# Patient Record
Sex: Female | Born: 1970
Health system: Southern US, Community
[De-identification: ages and names within clinical notes are randomized; demographics above are authoritative.]

---

## 2000-10-14 ENCOUNTER — Other Ambulatory Visit: Admission: RE | Admit: 2000-10-14 | Discharge: 2000-10-14 | Payer: Self-pay | Admitting: Obstetrics & Gynecology

## 2001-11-06 ENCOUNTER — Other Ambulatory Visit: Admission: RE | Admit: 2001-11-06 | Discharge: 2001-11-06 | Payer: Self-pay | Admitting: Obstetrics & Gynecology

## 2002-11-13 ENCOUNTER — Other Ambulatory Visit: Admission: RE | Admit: 2002-11-13 | Discharge: 2002-11-13 | Payer: Self-pay | Admitting: Obstetrics & Gynecology

## 2003-11-10 ENCOUNTER — Other Ambulatory Visit: Admission: RE | Admit: 2003-11-10 | Discharge: 2003-11-10 | Payer: Self-pay | Admitting: Obstetrics & Gynecology

## 2004-06-02 ENCOUNTER — Inpatient Hospital Stay (HOSPITAL_COMMUNITY): Admission: AD | Admit: 2004-06-02 | Discharge: 2004-06-04 | Payer: Self-pay | Admitting: Obstetrics & Gynecology

## 2004-06-02 ENCOUNTER — Encounter (INDEPENDENT_AMBULATORY_CARE_PROVIDER_SITE_OTHER): Payer: Self-pay | Admitting: *Deleted

## 2008-02-09 ENCOUNTER — Inpatient Hospital Stay (HOSPITAL_COMMUNITY): Admission: AD | Admit: 2008-02-09 | Discharge: 2008-02-11 | Payer: Self-pay | Admitting: Obstetrics & Gynecology

## 2009-03-25 ENCOUNTER — Ambulatory Visit: Payer: Self-pay | Admitting: Internal Medicine

## 2009-05-23 ENCOUNTER — Ambulatory Visit: Payer: Self-pay | Admitting: Internal Medicine

## 2010-06-11 NOTE — L&D Delivery Note (Signed)
Delivery Note At 11:43 AM a viable female was delivered via Vaginal, Spontaneous Delivery (Presentation: Right Occiput Anterior).  APGAR: 9, 9; weight 8 lb 3.6 oz (3730 g).   Placenta status: Intact, Spontaneous.  Cord: 3 vessels with the following complications: None.  Cord pH: na  Anesthesia: Epidural  Episiotomy: None Lacerations: 2nd degree Suture Repair: 2.0 vicryl rapide Est. Blood Loss (mL): 500  Mom to postpartum.  Baby to nursery-stable.  Eason Housman J 01/21/2011, 12:14 PM

## 2010-06-20 LAB — ABO/RH

## 2010-06-20 LAB — RUBELLA ANTIBODY, IGM: Rubella: IMMUNE

## 2010-07-02 ENCOUNTER — Encounter: Payer: Self-pay | Admitting: Obstetrics & Gynecology

## 2011-01-21 ENCOUNTER — Encounter (HOSPITAL_COMMUNITY): Payer: Self-pay | Admitting: *Deleted

## 2011-01-21 ENCOUNTER — Encounter (HOSPITAL_COMMUNITY): Payer: Self-pay | Admitting: Anesthesiology

## 2011-01-21 ENCOUNTER — Inpatient Hospital Stay (HOSPITAL_COMMUNITY): Payer: 59 | Admitting: Anesthesiology

## 2011-01-21 ENCOUNTER — Inpatient Hospital Stay (HOSPITAL_COMMUNITY)
Admission: AD | Admit: 2011-01-21 | Discharge: 2011-01-22 | DRG: 775 | Disposition: A | Discharge status: Home / Self Care | Payer: 59 | Source: Ambulatory Visit | Attending: Obstetrics and Gynecology | Admitting: Obstetrics and Gynecology

## 2011-01-21 DIAGNOSIS — O09529 Supervision of elderly multigravida, unspecified trimester: Secondary | ICD-10-CM | POA: Diagnosis present

## 2011-01-21 DIAGNOSIS — O9903 Anemia complicating the puerperium: Secondary | ICD-10-CM | POA: Diagnosis not present

## 2011-01-21 DIAGNOSIS — O99892 Other specified diseases and conditions complicating childbirth: Secondary | ICD-10-CM | POA: Diagnosis present

## 2011-01-21 DIAGNOSIS — Z22330 Carrier of Group B streptococcus: Secondary | ICD-10-CM

## 2011-01-21 DIAGNOSIS — D509 Iron deficiency anemia, unspecified: Secondary | ICD-10-CM | POA: Diagnosis present

## 2011-01-21 DIAGNOSIS — D62 Acute posthemorrhagic anemia: Secondary | ICD-10-CM | POA: Diagnosis not present

## 2011-01-21 LAB — COMPREHENSIVE METABOLIC PANEL
ALT: 12 U/L (ref 0–35)
AST: 15 U/L (ref 0–37)
Albumin: 2.6 g/dL — ABNORMAL LOW (ref 3.5–5.2)
Alkaline Phosphatase: 163 U/L — ABNORMAL HIGH (ref 39–117)
BUN: 6 mg/dL (ref 6–23)
CO2: 20 mEq/L (ref 19–32)
Calcium: 8.5 mg/dL (ref 8.4–10.5)
Chloride: 102 mEq/L (ref 96–112)
Creatinine, Ser: <0.47 mg/dL — ABNORMAL LOW (ref 0.50–1.10)
Glucose, Bld: 87 mg/dL (ref 70–99)
Potassium: 3.8 mEq/L (ref 3.5–5.1)
Sodium: 131 mEq/L — ABNORMAL LOW (ref 135–145)
Total Bilirubin: 0.2 mg/dL — ABNORMAL LOW (ref 0.3–1.2)
Total Protein: 5.7 g/dL — ABNORMAL LOW (ref 6.0–8.3)

## 2011-01-21 LAB — CBC
HCT: 30 % — ABNORMAL LOW (ref 36.0–46.0)
Hemoglobin: 10.1 g/dL — ABNORMAL LOW (ref 12.0–15.0)
MCH: 29.7 pg (ref 26.0–34.0)
MCHC: 33.7 g/dL (ref 30.0–36.0)
MCV: 88.2 fL (ref 78.0–100.0)
Platelets: 241 10*3/uL (ref 150–400)
RBC: 3.4 MIL/uL — ABNORMAL LOW (ref 3.87–5.11)
RDW: 13.6 % (ref 11.5–15.5)
WBC: 9.5 10*3/uL (ref 4.0–10.5)

## 2011-01-21 LAB — ABO/RH: ABO/RH(D): O POS

## 2011-01-21 LAB — URIC ACID: Uric Acid, Serum: 3.3 mg/dL (ref 2.4–7.0)

## 2011-01-21 LAB — RPR: RPR Ser Ql: NONREACTIVE

## 2011-01-21 MED ORDER — DIPHENHYDRAMINE HCL 25 MG PO CAPS: 25.0000 mg | ORAL_CAPSULE | Freq: Four times a day (QID) | ORAL | Status: DC | PRN

## 2011-01-21 MED ORDER — OXYCODONE-ACETAMINOPHEN 5-325 MG PO TABS: 1.0000 | ORAL_TABLET | ORAL | Status: DC | PRN

## 2011-01-21 MED ORDER — TETANUS-DIPHTH-ACELL PERTUSSIS 5-2.5-18.5 LF-MCG/0.5 IM SUSP
0.5000 mL | Freq: Once | INTRAMUSCULAR | Status: AC
  Administered 2011-01-22: 0.5 mL via INTRAMUSCULAR
  Filled 2011-01-21: qty 0.5

## 2011-01-21 MED ORDER — OXYTOCIN BOLUS FROM INFUSION
500.0000 mL | Freq: Once | INTRAVENOUS | Status: DC
  Filled 2011-01-21: qty 500

## 2011-01-21 MED ORDER — DIPHENHYDRAMINE HCL 50 MG/ML IJ SOLN: 12.5000 mg | INTRAMUSCULAR | Status: DC | PRN

## 2011-01-21 MED ORDER — PRENATAL PLUS 27-1 MG PO TABS
1.0000 | ORAL_TABLET | Freq: Every day | ORAL | Status: DC
  Administered 2011-01-21 – 2011-01-22 (×2): 1 via ORAL
  Filled 2011-01-21 (×2): qty 1

## 2011-01-21 MED ORDER — IBUPROFEN 600 MG PO TABS
600.0000 mg | ORAL_TABLET | Freq: Four times a day (QID) | ORAL | Status: DC
  Administered 2011-01-21 – 2011-01-22 (×5): 600 mg via ORAL
  Filled 2011-01-21 (×5): qty 1

## 2011-01-21 MED ORDER — SENNOSIDES-DOCUSATE SODIUM 8.6-50 MG PO TABS
2.0000 | ORAL_TABLET | Freq: Every day | ORAL | Status: DC
  Administered 2011-01-21: 2 via ORAL

## 2011-01-21 MED ORDER — PHENYLEPHRINE 40 MCG/ML (10ML) SYRINGE FOR IV PUSH (FOR BLOOD PRESSURE SUPPORT)
80.0000 ug | PREFILLED_SYRINGE | INTRAVENOUS | Status: DC | PRN
  Filled 2011-01-21 (×2): qty 5

## 2011-01-21 MED ORDER — SIMETHICONE 80 MG PO CHEW: 80.0000 mg | CHEWABLE_TABLET | ORAL | Status: DC | PRN

## 2011-01-21 MED ORDER — METHYLERGONOVINE MALEATE 0.2 MG/ML IJ SOLN: 0.2000 mg | INTRAMUSCULAR | Status: DC | PRN

## 2011-01-21 MED ORDER — DIBUCAINE 1 % RE OINT: 1.0000 "application " | TOPICAL_OINTMENT | RECTAL | Status: DC | PRN

## 2011-01-21 MED ORDER — FLEET ENEMA 7-19 GM/118ML RE ENEM: 1.0000 | ENEMA | RECTAL | Status: DC | PRN

## 2011-01-21 MED ORDER — ONDANSETRON HCL 4 MG/2ML IJ SOLN: 4.0000 mg | INTRAMUSCULAR | Status: DC | PRN

## 2011-01-21 MED ORDER — LIDOCAINE HCL 1.5 % IJ SOLN
INTRAMUSCULAR | Status: DC | PRN
  Administered 2011-01-21 (×2): 4 mL via EPIDURAL

## 2011-01-21 MED ORDER — LACTATED RINGERS IV SOLN: 500.0000 mL | INTRAVENOUS | Status: DC | PRN

## 2011-01-21 MED ORDER — IBUPROFEN 600 MG PO TABS: 600.0000 mg | ORAL_TABLET | Freq: Four times a day (QID) | ORAL | Status: DC | PRN

## 2011-01-21 MED ORDER — CLINDAMYCIN PHOSPHATE 900 MG/50ML IV SOLN
900.0000 mg | Freq: Three times a day (TID) | INTRAVENOUS | Status: DC
  Administered 2011-01-21: 900 mg via INTRAVENOUS
  Filled 2011-01-21 (×4): qty 50

## 2011-01-21 MED ORDER — OXYTOCIN 20 UNITS IN LACTATED RINGERS INFUSION - SIMPLE: 125.0000 mL/h | INTRAVENOUS | Status: DC | PRN

## 2011-01-21 MED ORDER — ZOLPIDEM TARTRATE 5 MG PO TABS: 5.0000 mg | ORAL_TABLET | Freq: Every evening | ORAL | Status: DC | PRN

## 2011-01-21 MED ORDER — OXYTOCIN 20 UNITS IN LACTATED RINGERS INFUSION - SIMPLE
125.0000 mL/h | INTRAVENOUS | Status: DC
  Filled 2011-01-21: qty 1000

## 2011-01-21 MED ORDER — ONDANSETRON HCL 4 MG PO TABS: 4.0000 mg | ORAL_TABLET | ORAL | Status: DC | PRN

## 2011-01-21 MED ORDER — LACTATED RINGERS IV SOLN: INTRAVENOUS | Status: DC

## 2011-01-21 MED ORDER — WITCH HAZEL-GLYCERIN EX PADS: 1.0000 "application " | MEDICATED_PAD | CUTANEOUS | Status: DC | PRN

## 2011-01-21 MED ORDER — ACETAMINOPHEN 325 MG PO TABS: 650.0000 mg | ORAL_TABLET | ORAL | Status: DC | PRN

## 2011-01-21 MED ORDER — LACTATED RINGERS IV SOLN
INTRAVENOUS | Status: DC
  Administered 2011-01-21 (×2): via INTRAVENOUS

## 2011-01-21 MED ORDER — FENTANYL 2.5 MCG/ML BUPIVACAINE 1/10 % EPIDURAL INFUSION (WH - ANES)
14.0000 mL/h | INTRAMUSCULAR | Status: DC
  Administered 2011-01-21 (×2): 14 mL/h via EPIDURAL
  Filled 2011-01-21 (×3): qty 60

## 2011-01-21 MED ORDER — EPHEDRINE 5 MG/ML INJ
10.0000 mg | INTRAVENOUS | Status: DC | PRN
  Filled 2011-01-21: qty 4

## 2011-01-21 MED ORDER — LIDOCAINE HCL (PF) 1 % IJ SOLN
30.0000 mL | INTRAMUSCULAR | Status: DC | PRN
  Administered 2011-01-21: 30 mL via SUBCUTANEOUS
  Filled 2011-01-21: qty 30

## 2011-01-21 MED ORDER — ONDANSETRON HCL 4 MG/2ML IJ SOLN: 4.0000 mg | Freq: Four times a day (QID) | INTRAMUSCULAR | Status: DC | PRN

## 2011-01-21 MED ORDER — TERBUTALINE SULFATE 1 MG/ML IJ SOLN: 0.2500 mg | Freq: Once | INTRAMUSCULAR | Status: DC | PRN

## 2011-01-21 MED ORDER — BENZOCAINE-MENTHOL 20-0.5 % EX AERO
1.0000 "application " | INHALATION_SPRAY | CUTANEOUS | Status: DC | PRN
  Administered 2011-01-21: 1 via TOPICAL

## 2011-01-21 MED ORDER — LACTATED RINGERS IV SOLN: 500.0000 mL | Freq: Once | INTRAVENOUS | Status: DC

## 2011-01-21 MED ORDER — EPHEDRINE 5 MG/ML INJ
10.0000 mg | INTRAVENOUS | Status: DC | PRN
  Filled 2011-01-21 (×2): qty 4

## 2011-01-21 MED ORDER — CITRIC ACID-SODIUM CITRATE 334-500 MG/5ML PO SOLN: 30.0000 mL | ORAL | Status: DC | PRN

## 2011-01-21 MED ORDER — BENZOCAINE-MENTHOL 20-0.5 % EX AERO
INHALATION_SPRAY | CUTANEOUS | Status: AC
  Administered 2011-01-21: 1 via TOPICAL
  Filled 2011-01-21: qty 56

## 2011-01-21 MED ORDER — PHENYLEPHRINE 40 MCG/ML (10ML) SYRINGE FOR IV PUSH (FOR BLOOD PRESSURE SUPPORT)
80.0000 ug | PREFILLED_SYRINGE | INTRAVENOUS | Status: DC | PRN
  Filled 2011-01-21: qty 5

## 2011-01-21 MED ORDER — OXYTOCIN 20 UNITS IN LACTATED RINGERS INFUSION - SIMPLE
1.0000 m[IU]/min | INTRAVENOUS | Status: DC
  Administered 2011-01-21: 333 m[IU]/min via INTRAVENOUS
  Administered 2011-01-21: 2 m[IU]/min via INTRAVENOUS

## 2011-01-21 MED ORDER — LANOLIN HYDROUS EX OINT: TOPICAL_OINTMENT | CUTANEOUS | Status: DC | PRN

## 2011-01-21 MED ORDER — METHYLERGONOVINE MALEATE 0.2 MG PO TABS: 0.2000 mg | ORAL_TABLET | ORAL | Status: DC | PRN

## 2011-01-21 NOTE — Progress Notes (Signed)
Pt moved to 143

## 2011-01-21 NOTE — Progress Notes (Signed)
Pt states, " Pt states, I started having contractions at 2200 and they are every three in now. I 've had bloody show all day and a trickle of water at 2 am."

## 2011-01-21 NOTE — Progress Notes (Signed)
Dr. Billy Coast notified of pt status, SVE, FHR, and UC pattern.  Orders received, will continue to monitor.

## 2011-01-21 NOTE — Progress Notes (Signed)
Dr. Billy Coast at bedside and notified of pt status, SVE, FHR, UC pattern, and pitocin amount. AROM, will continue to monitor.

## 2011-01-21 NOTE — Progress Notes (Signed)
Shawna White is a 40 y.o. G3P2 at [redacted]w[redacted]d by LMP admitted for active labor  Subjective:   Objective: BP 126/78  Pulse 90  Temp(Src) 98 F (36.7 C) (Oral)  Resp 18  Ht 5' 5.5" (1.664 m)  Wt 106.595 kg (235 lb)  BMI 38.51 kg/m2      FHT:  FHR: 150 bpm, variability: moderate,  accelerations:  Present,  decelerations:  Absent UC:   regular, every 2 minutes SVE:   9/100%/vtx/0 AROM(clear)  Labs: Lab Results  Component Value Date   WBC 9.5 01/21/2011   HGB 10.1* 01/21/2011   HCT 30.0* 01/21/2011   MCV 88.2 01/21/2011   PLT 241 01/21/2011    Assessment / Plan: Spontaneous labor, progressing normally  Labor: Progressing normally Preeclampsia:  na Fetal Wellbeing:  Category I Pain Control:  Epidural I/D:  n/a Anticipated MOD:  NSVD  Shawna White J 01/21/2011, 11:09 AM

## 2011-01-21 NOTE — Anesthesia Preprocedure Evaluation (Signed)
Anesthesia Evaluation  Name, MR# and DOB Patient awake  General Assessment Comment  Reviewed: Allergy & Precautions, H&P , NPO status , Patient's Chart, lab work & pertinent test results  Airway Mallampati: III TM Distance: >3 FB Neck ROM: full    Dental No notable dental hx. (+) Teeth Intact   Pulmonary  clear to auscultation  pulmonary exam normalPulmonary Exam Normal breath sounds clear to auscultation none    Cardiovascular regular Normal    Neuro/Psych Negative Neurological ROS  Negative Psych ROS  GI/Hepatic/Renal negative GI ROS, negative Liver ROS, and negative Renal ROS (+)       Endo/Other  Negative Endocrine ROS (+)   Morbid obesity  Abdominal   Musculoskeletal   Hematology negative hematology ROS (+)   Peds  Reproductive/Obstetrics (+) Pregnancy    Anesthesia Other Findings             Anesthesia Physical Anesthesia Plan  ASA: III  Anesthesia Plan: Epidural   Post-op Pain Management:    Induction:   Airway Management Planned:   Additional Equipment:   Intra-op Plan:   Post-operative Plan:   Informed Consent: I have reviewed the patients History and Physical, chart, labs and discussed the procedure including the risks, benefits and alternatives for the proposed anesthesia with the patient or authorized representative who has indicated his/her understanding and acceptance.     Plan Discussed with: Anesthesiologist  Anesthesia Plan Comments:         Anesthesia Quick Evaluation

## 2011-01-21 NOTE — Progress Notes (Signed)
Pt eating lunch

## 2011-01-21 NOTE — ED Notes (Signed)
Pt to BS via w/c 

## 2011-01-21 NOTE — Progress Notes (Signed)
Dr. Billy Coast at bedside and delivery and notified of pt status and reviewing FHR tracing.

## 2011-01-21 NOTE — Progress Notes (Signed)
  S: Feeling no pain or pressure with epidural     No PIH symptoms  O:  VS: Blood pressure 143/83, pulse 78, temperature 97.8 F (36.6 C), temperature source Oral, resp. rate 18, height 5' 5.5" (1.664 m), weight 106.595 kg (235 lb).        FHR : baseline 140 / variability moderate / accels +  / decels none        EFM: Category 1        Toco: contractions every 2-4 minutes / moderate lasting 40-60 sec        Cervix : 7 cm / 90 % / vtx / -1 per RN exam in past 20 minutes with foley and position change        Membranes: intact        Clindamycin dose infused over an hour - no reaction          Labs: CBC 9.5 - 10.1 -30.0 - 241 / uric acid 3.3 / SGOT 15 / SGPT 12  A: Active labor      No evidence of preeclampsia  P: Expectant management      Recheck in next hour for progression  - consider AROM or pitocin augmentation if no preogression      MD to follow - report to Rande Lawman 01/21/2011, 7:24 AM

## 2011-01-21 NOTE — Progress Notes (Signed)
NSVD, Dr. Billy Coast, female, apgars,9,9.

## 2011-01-21 NOTE — Anesthesia Procedure Notes (Addendum)
Epidural Patient location during procedure: OB Start time: 01/21/2011 4:03 AM  Staffing Anesthesiologist: Valissa Lyvers A. Performed by: anesthesiologist   Preanesthetic Checklist Completed: patient identified, site marked, surgical consent, pre-op evaluation, timeout performed, IV checked, risks and benefits discussed and monitors and equipment checked  Epidural Patient position: sitting Prep: site prepped and draped and DuraPrep Patient monitoring: continuous pulse ox and blood pressure Approach: midline Injection technique: LOR air  Needle:  Needle type: Tuohy  Needle gauge: 17 G Needle length: 9 cm Needle insertion depth: 7 cm Catheter type: closed end flexible Catheter size: 19 Gauge Catheter at skin depth: 12 cm Test dose: negative and 1.5% lidocaine  Assessment Events: blood not aspirated, injection not painful, no injection resistance, negative IV test and no paresthesia  Additional Notes Patient is more comfortable after epidural dosed. Please see RN's note for documentation of vital signs and FHR which are stable.

## 2011-01-21 NOTE — Progress Notes (Signed)
Report called to [facility] in [facility]

## 2011-01-21 NOTE — H&P (Signed)
  Shawna White is a 40 y.o. female presenting for onset of labor. [facility] at [facility] since 8 wks with Dr Seymour Bars as primary.  OB History    Grav Para Term Preterm Abortions TAB SAB Ect Mult Living   3 2        2      SVD x 2 - + GBS in past 2 pregnancies (does not remember what medicine that she received) Proven to 8-13 last delivery  History reviewed. No pertinent past medical history. Obesity. History reviewed. No pertinent past surgical history. Family History: family history is not on file. Social History:  does not have a smoking history on file. She does not have any smokeless tobacco history on file. Her alcohol and drug histories not on file.  Physical Exam: NAD but very uncomfortable with ctx - requesting epidural Heart RRR Lungs tachypnea with ctx, clear Abdomen gravid and non-tender Extremities trace edema  Cervical exam:  Dilation: 5 Effacement (%): 80 Station: -1 Exam by:: Quintella Baton RNC Blood pressure 143/86, pulse 85, temperature 97.3 F (36.3 C), temperature source Oral, resp. rate 18, height 5' 5.5" (1.664 m), weight 106.595 kg (235 lb).    Prenatal labs: ABO, Rh:   O + Antibody: Negative (01/10 0000) Rubella:  Immune RPR: Nonreactive (01/10 0000)  HBsAg: Negative (01/10 0000)  HIV: Non-reactive (01/10 0000)  GBS: Positive (07/09 0000)  1 hr Glucola- 135 Genetic screening- normal Anatomy US - normal   Assessment/Plan: Active labor with + GBS  1) admit 2) epidural 3) consulted pharmacist for ABX - recommended Clindamycin because stated allergy was to specifically erythromycin (+sensitivities)  4) add PIH labs for elevated BP  Shawna White 01/21/2011, 3:58 AM

## 2011-01-21 NOTE — Progress Notes (Signed)
Wiliam Ke CNM notified of pt's admission and status. Aware of ctx pattern, sve, +GBS and allergies. Will admit to BS.

## 2011-01-22 DIAGNOSIS — D509 Iron deficiency anemia, unspecified: Secondary | ICD-10-CM | POA: Diagnosis present

## 2011-01-22 LAB — CBC
HCT: 24.7 % — ABNORMAL LOW (ref 36.0–46.0)
Hemoglobin: 8.3 g/dL — ABNORMAL LOW (ref 12.0–15.0)
MCHC: 33.6 g/dL (ref 30.0–36.0)
RDW: 13.8 % (ref 11.5–15.5)
WBC: 9.1 10*3/uL (ref 4.0–10.5)

## 2011-01-22 MED ORDER — IBUPROFEN 800 MG PO TABS: 800.0000 mg | ORAL_TABLET | Freq: Three times a day (TID) | ORAL | Status: AC | PRN

## 2011-01-22 MED ORDER — NIFEREX-150 150-50-50 MG PO CAPS: 150.0000 mg | ORAL_CAPSULE | ORAL | Status: DC

## 2011-01-22 NOTE — Progress Notes (Signed)
  PPD 1 SVD  S:  Reports feeling well - desires early discharge             Tolerating po/ No nausea or vomiting             Bleeding is light             Pain controlled withprescription NSAID's including motrin with minimal cramp relief and narcotic analgesics including percocet yesterday - none today             Up ad lib / ambulatory  Newborn breast feeding               No PIH symptoms   O:  A & O x 3 NAD             VS: Blood pressure 120/77, pulse 88, temperature 98.5 F (36.9 C), temperature source Oral, resp. rate 18, height 5' 5.5" (1.664 m), weight 106.595 kg (235 lb), SpO2 98.00%, unknown if currently breastfeeding.  LABS:  Basename 01/22/11 0505 01/21/11 0307  HGB 8.3* 10.1*  HCT 24.7* 30.0*    I&O: I/O last 3 completed shifts: In: -  Out: 1000 [Blood:1000]      Lungs: Clear and unlabored  Heart: regular rate and rhythm / no mumurs  Abdomen: soft, non-tender, non-distended             Fundus: firm, non-tender, U-1  Perineum: no edema  Lochia: light  Extremities: trace edema, no calf pain or tenderness, negative Homans    A/P: PPD # 1 SVD             Labile hypertension - no previous history HTN / no evidence of PEC              ABL anemia compounding iron deficiency anemia  Doing well - stable status  Routine post partum orders  Discharge today after lunch - BP in office next week  Aceton Kinnear 01/22/2011, 9:48 AM

## 2011-01-22 NOTE — Discharge Summary (Signed)
Obstetric Discharge Summary Reason for Admission: onset of labor Prenatal Procedures: none Intrapartum Procedures: spontaneous vaginal delivery Postpartum Procedures: none Complications-Operative and Postpartum: none Hemoglobin  Date Value Range Status  01/22/2011 8.3* 12.0-15.0 (g/dL) Final     DELTA CHECK NOTED     REPEATED TO VERIFY     HCT  Date Value Range Status  01/22/2011 24.7* 36.0-46.0 (%) Final    Discharge Diagnoses: Term Pregnancy-delivered  Discharge Information: Date: 01/22/2011 Activity: pelvic rest Diet: routine Medications: Ibuprophen prenatal vitamin, nifferex (iron) Condition: stable Instructions: refer to practice specific booklet Discharge to: home Follow-up Information    Follow up with [name],[name]. Make an appointment in 6 weeks. (1 week Blood pressure check - call for apt)    Contact information:   [address] [place] [address] [phone]          Newborn Data: Live born female  Birth Weight: 8 lb 3.6 oz (3730 g) APGAR: 9, 9  Home with mother.  Shawna White,Shawna White 01/22/2011, 9:58 AM

## 2011-01-23 ENCOUNTER — Inpatient Hospital Stay (HOSPITAL_COMMUNITY): Admission: RE | Admit: 2011-01-23 | Payer: 59 | Source: Ambulatory Visit

## 2011-01-23 NOTE — Anesthesia Postprocedure Evaluation (Signed)
  Anesthesia Post-op Note  Patient: Shawna White  Procedure(s) Performed: * Lumbar Epidural for L & D *  Patient Location: Labor and Delivery  Anesthesia Type: Epidural  Level of Consciousness: awake, alert  and oriented  Airway and Oxygen Therapy: Patient Spontanous Breathing  Post-op Pain: none  Post-op Assessment: Post-op Vital signs reviewed, Patient's Cardiovascular Status Stable, Respiratory Function Stable, Patent Airway, No signs of Nausea or vomiting, Pain level controlled, No headache, No backache, No residual numbness and No residual motor weakness  Post-op Vital Signs: Reviewed and stable  Complications: No apparent anesthesia complications

## 2011-03-14 LAB — CBC
MCHC: 34.3
Platelets: 191
RDW: 13.3

## 2014-04-12 ENCOUNTER — Encounter (HOSPITAL_COMMUNITY): Payer: Self-pay | Admitting: *Deleted

## 2016-03-26 ENCOUNTER — Other Ambulatory Visit: Payer: Self-pay | Admitting: Endocrinology

## 2016-03-26 DIAGNOSIS — E041 Nontoxic single thyroid nodule: Secondary | ICD-10-CM

## 2016-03-30 ENCOUNTER — Ambulatory Visit
Admission: RE | Admit: 2016-03-30 | Discharge: 2016-03-30 | Disposition: A | Discharge status: Home / Self Care | Payer: 59 | Source: Ambulatory Visit | Attending: Endocrinology | Admitting: Endocrinology

## 2016-03-30 DIAGNOSIS — E041 Nontoxic single thyroid nodule: Secondary | ICD-10-CM

## 2016-04-05 ENCOUNTER — Other Ambulatory Visit: Payer: Self-pay | Admitting: Endocrinology

## 2016-04-05 DIAGNOSIS — E041 Nontoxic single thyroid nodule: Secondary | ICD-10-CM

## 2016-04-12 ENCOUNTER — Other Ambulatory Visit (HOSPITAL_COMMUNITY)
Admission: RE | Admit: 2016-04-12 | Discharge: 2016-04-12 | Disposition: A | Discharge status: Home / Self Care | Payer: 59 | Source: Ambulatory Visit | Attending: Radiology | Admitting: Radiology

## 2016-04-12 ENCOUNTER — Ambulatory Visit
Admission: RE | Admit: 2016-04-12 | Discharge: 2016-04-12 | Disposition: A | Discharge status: Home / Self Care | Payer: 59 | Source: Ambulatory Visit | Attending: Endocrinology | Admitting: Endocrinology

## 2016-04-12 DIAGNOSIS — E042 Nontoxic multinodular goiter: Secondary | ICD-10-CM | POA: Insufficient documentation

## 2016-04-12 DIAGNOSIS — E041 Nontoxic single thyroid nodule: Secondary | ICD-10-CM

## 2017-03-19 ENCOUNTER — Encounter: Payer: Self-pay | Admitting: Obstetrics & Gynecology

## 2017-03-19 ENCOUNTER — Ambulatory Visit (INDEPENDENT_AMBULATORY_CARE_PROVIDER_SITE_OTHER): Payer: 59 | Admitting: Obstetrics & Gynecology

## 2017-03-19 VITALS — BP 140/84 | Ht 64.75 in | Wt 214.0 lb

## 2017-03-19 DIAGNOSIS — N943 Premenstrual tension syndrome: Secondary | ICD-10-CM | POA: Diagnosis not present

## 2017-03-19 DIAGNOSIS — Z975 Presence of (intrauterine) contraceptive device: Secondary | ICD-10-CM

## 2017-03-19 DIAGNOSIS — N920 Excessive and frequent menstruation with regular cycle: Secondary | ICD-10-CM

## 2017-03-19 DIAGNOSIS — Z01419 Encounter for gynecological examination (general) (routine) without abnormal findings: Secondary | ICD-10-CM

## 2017-03-19 DIAGNOSIS — Z23 Encounter for immunization: Secondary | ICD-10-CM

## 2017-03-19 NOTE — Progress Notes (Signed)
Shawna White Apr 24, 1971 [id number]   History:    46 y.o. G3P3 married.  Children 12-9 and 14 yo.  RP:  Established patient presenting for annual gyn exam   HPI:  Well on Mirena IUD x 09/2013, but prolonged bleeding x last 2 months in spite of being on Camila as well for cycle control and PMS.  LMP lasted 3.5 wks, the one before 2 wks.  No pelvic pain.  No abnormal d/c.  Breasts wnl.  Mictions/BMs wnl.  BMI 35.89.  Past medical history,surgical history, family history and social history were all reviewed and documented in the EPIC chart.  Gynecologic History Patient's last menstrual period was 02/18/2017. Contraception: IUD and oral progesterone-only contraceptive Last Pap: 02/2016. Results were: neg/HPV HR neg Last mammogram: 01/2015. Results were: Negative  Obstetric History OB History  Gravida Para Term Preterm AB Living  SAB TAB Ectopic Multiple Live Births          1    # Outcome Date GA Lbr Len/2nd Weight Sex Delivery Anes PTL Lv  3 Term 01/21/11 [redacted]w[redacted]d 13:25 / 00:18 8 lb 3.6 oz (3.73 kg) F Vag-Spont EPI  LIV     Birth Comments: WNL  2 Para           1 Para                ROS: A ROS was performed and pertinent positives and negatives are included in the history.  GENERAL: No fevers or chills. HEENT: No change in vision, no earache, sore throat or sinus congestion. NECK: No pain or stiffness. CARDIOVASCULAR: No chest pain or pressure. No palpitations. PULMONARY: No shortness of breath, cough or wheeze. GASTROINTESTINAL: No abdominal pain, nausea, vomiting or diarrhea, melena or bright red blood per rectum. GENITOURINARY: No urinary frequency, urgency, hesitancy or dysuria. MUSCULOSKELETAL: No joint or muscle pain, no back pain, no recent trauma. DERMATOLOGIC: No rash, no itching, no lesions. ENDOCRINE: No polyuria, polydipsia, no heat or cold intolerance. No recent change in weight. HEMATOLOGICAL: No anemia or easy bruising or bleeding. NEUROLOGIC: No headache,  seizures, numbness, tingling or weakness. PSYCHIATRIC: No depression, no loss of interest in normal activity or change in sleep pattern.     Exam:   BP 140/84   Ht 5' 4.75" (1.645 m)   Wt 214 lb (97.1 kg)   LMP 02/18/2017   BMI 35.89 kg/m   Body mass index is 35.89 kg/m.  General appearance : Well developed well nourished female. No acute distress HEENT: Eyes: no retinal hemorrhage or exudates,  Neck supple, trachea midline, no carotid bruits, no thyroidmegaly Lungs: Clear to auscultation, no rhonchi or wheezes, or rib retractions  Heart: Regular rate and rhythm, no murmurs or gallops Breast:Examined in sitting and supine position were symmetrical in appearance, no palpable masses or tenderness,  no skin retraction, no nipple inversion, no nipple discharge, no skin discoloration, no axillary or supraclavicular lymphadenopathy Abdomen: no palpable masses or tenderness, no rebound or guarding Extremities: no edema or skin discoloration or tenderness  Pelvic: Vulva normal  Bartholin, Urethra, Skene Glands: Within normal limits             Vagina: No gross lesions or discharge  Cervix: No gross lesions or discharge.  Strings seen/felt at exo-cervix.  Uterus  AV, normal size, shape and consistency, non-tender and mobile  Adnexa  Without masses or tenderness  Anus and perineum  normal    Assessment/Plan:  46 y.o. female for annual exam   1. Well female exam with routine gynecological exam Normal gyn exam.  Pap neg/HPV HR neg 02/2016.  Breasts wnl.  Will schedule screening Mammo.  2. IUD contraception Mirena IUD in good location x 09/2013.  3. PMS (premenstrual syndrome) Not completely controled with Progestin Pill.  4. Menorrhagia with regular cycle In spite of Mirena IUD and Camila.  R/O Uterine/Endometrial pathology. - US Transvaginal Non-OB; Future  Counseling on above issues >50% x 10 minutes  [name], 2:22 PM 03/19/2017

## 2017-03-19 NOTE — Addendum Note (Signed)
Addended by: Berna Spare A on: 03/19/2017 03:08 PM   Modules accepted: Orders

## 2017-03-19 NOTE — Patient Instructions (Signed)
1. Well female exam with routine gynecological exam Normal gyn exam.  Pap neg/HPV HR neg 02/2016.  Breasts wnl.  Will schedule screening Mammo.  2. IUD contraception Mirena IUD in good location x 09/2013.  3. PMS (premenstrual syndrome) Not completely controled with Progestin Pill.  4. Menorrhagia with regular cycle In spite of Mirena IUD and Camila.  R/O Uterine/Endometrial pathology. - US Transvaginal Non-OB; Future  Page, it was a pleasure to see you today!  I will see you again soon!

## 2017-03-25 ENCOUNTER — Encounter: Payer: Self-pay | Admitting: Obstetrics & Gynecology

## 2017-03-25 ENCOUNTER — Ambulatory Visit (INDEPENDENT_AMBULATORY_CARE_PROVIDER_SITE_OTHER): Payer: 59

## 2017-03-25 ENCOUNTER — Other Ambulatory Visit: Payer: Self-pay | Admitting: Obstetrics & Gynecology

## 2017-03-25 ENCOUNTER — Ambulatory Visit (INDEPENDENT_AMBULATORY_CARE_PROVIDER_SITE_OTHER): Payer: 59 | Admitting: Obstetrics & Gynecology

## 2017-03-25 ENCOUNTER — Telehealth: Payer: Self-pay | Admitting: *Deleted

## 2017-03-25 VITALS — BP 138/98

## 2017-03-25 DIAGNOSIS — T839XXA Unspecified complication of genitourinary prosthetic device, implant and graft, initial encounter: Secondary | ICD-10-CM

## 2017-03-25 DIAGNOSIS — Z975 Presence of (intrauterine) contraceptive device: Secondary | ICD-10-CM

## 2017-03-25 DIAGNOSIS — N921 Excessive and frequent menstruation with irregular cycle: Secondary | ICD-10-CM

## 2017-03-25 DIAGNOSIS — N920 Excessive and frequent menstruation with regular cycle: Secondary | ICD-10-CM

## 2017-03-25 DIAGNOSIS — R1900 Intra-abdominal and pelvic swelling, mass and lump, unspecified site: Secondary | ICD-10-CM

## 2017-03-25 DIAGNOSIS — R9389 Abnormal findings on diagnostic imaging of other specified body structures: Secondary | ICD-10-CM

## 2017-03-25 DIAGNOSIS — N939 Abnormal uterine and vaginal bleeding, unspecified: Secondary | ICD-10-CM

## 2017-03-25 DIAGNOSIS — T8332XA Displacement of intrauterine contraceptive device, initial encounter: Secondary | ICD-10-CM

## 2017-03-25 DIAGNOSIS — T8389XA Other specified complication of genitourinary prosthetic devices, implants and grafts, initial encounter: Secondary | ICD-10-CM

## 2017-03-25 DIAGNOSIS — N831 Corpus luteum cyst of ovary, unspecified side: Secondary | ICD-10-CM | POA: Diagnosis not present

## 2017-03-25 DIAGNOSIS — Z30011 Encounter for initial prescription of contraceptive pills: Secondary | ICD-10-CM | POA: Diagnosis not present

## 2017-03-25 DIAGNOSIS — N7011 Chronic salpingitis: Secondary | ICD-10-CM | POA: Diagnosis not present

## 2017-03-25 MED ORDER — NORETHIN ACE-ETH ESTRAD-FE 1-20 MG-MCG(24) PO TABS: 1.0000 | ORAL_TABLET | Freq: Every day | ORAL | 4 refills | Status: DC

## 2017-03-25 NOTE — Progress Notes (Signed)
    Shawna White [birth year] [id number]        46 y.o.  G3P1003 Married  RP:  BTB on Mirena IUD with Camila for Pelvic US  HPI:  No change x last visit:  Well on Mirena IUD x 09/2013, but prolonged bleeding x last 2 months in spite of being on Camila as well for cycle control and PMS.  LMP lasted 3.5 wks, the one before 2 wks.  No pelvic pain.  No abnormal d/c.   Past medical history,surgical history, problem list, medications, allergies, family history and social history were all reviewed and documented in the EPIC chart.  Directed ROS with pertinent positives and negatives documented in the history of present illness/assessment and plan.  Exam:  Vitals:   03/25/17 1047  BP: (!) 138/98   General appearance:  Normal  Pelvic US today:  T/V and T/A Anteverted Uterus 9.3 x 6.83 x 4.76 cm.  Thick Endometrial line 11.4 mm with positive CFD.  IUD not seen in Endometrial cavity or in lower uterus.  Right ovary with Corpus Luteum cyst 2.8 x 1.7 cm with positive CFD at periphery.  Left ovarian cyst with low level internal echoes, but negative CFD.  Serpentine thin-walled cystic mass with internal low level echoes and negative CFD.  The mass folds on itself 8.0 x 2.3 x 3.9 cm c/w a hydrosalpinx.  Good arterial blood flow seen to left ovary.  No FF in CDS.  Gyn exam:  Vulva normal.  Speculum:  Cervix/vagina normal.  IUD strings not seen.  Bimanual exam:  Strings not felt.  AV uterus, NT.  No adnexal mass.  Assessment/Plan:  46 y.o. G3P1003   1. Breakthrough bleeding with IUD No IUD seen in IU cavity/Lower Uterus.  Endometrial line thickened at 11.4 mm with positive CFD.  Abdominopelvic X-Ray for migrated IUD, r/o abdominopelvic location.  If not seen on X-Ray, will conclude that it was expulsed out.  Not seen by patient and no strings felt or seen on gyn exam today.  F/U Sonohystero to r/o an IU lesion like a polyp/fibroid or Endometrial Hyperplasia/Cancer. - [place] Sonohysterogram; Future  2. IUD  migration, initial encounter [facility]) As above, will do X-Ray of abdominopelvic cavity.  3. Thickened endometrium R/O Endometrial pathology.  F/U Sonohysterogram/Possible EBx. - [place] Sonohysterogram; Future  4. Encounter for initial prescription of contraceptive pills Lomedia Fe 1/20 (24) prescribed.  Usage/Benefits/Risks reviewed.  Will use condoms the first pack.  Counseling on above issues >50% x 25 minutes.  [name], 11:10 AM 03/25/2017

## 2017-03-25 NOTE — Telephone Encounter (Signed)
Order placed at [facility] hospital pt aware she can walk in to have done. No appointment needed.

## 2017-03-25 NOTE — Telephone Encounter (Signed)
-----   Message from Genia Del, MD sent at 03/25/2017 11:37 AM EDT ----- Regarding: IUD not seen in Uterus/Migration Needs Abdominopelvic X-Ray to locate IUD.

## 2017-03-26 NOTE — Patient Instructions (Addendum)
1. Breakthrough bleeding with IUD No IUD seen in IU cavity/Lower Uterus.  Endometrial line thickened at 11.4 mm with positive CFD.  Abdominopelvic X-Ray for migrated IUD, r/o abdominopelvic location.  If not seen on X-Ray, will conclude that it was expulsed out.  Not seen by patient and no strings felt or seen on gyn exam today.  F/U Sonohystero to r/o an IU lesion like a polyp/fibroid or Endometrial Hyperplasia/Cancer. - [place] Sonohysterogram; Future  2. IUD migration, initial encounter [facility]) As above, will do X-Ray of abdominopelvic cavity.  3. Thickened endometrium R/O Endometrial pathology.  F/U Sonohysterogram/Possible EBx. - [place] Sonohysterogram; Future  4. Encounter for initial prescription of contraceptive pills Lomedia Fe 1/20 (24) prescribed.  Usage/Benefits/Risks reviewed.  Will use condoms the first pack.  Page, good to see you today!  See you again soon.

## 2017-04-01 ENCOUNTER — Telehealth: Payer: Self-pay | Admitting: *Deleted

## 2017-04-01 NOTE — Telephone Encounter (Signed)
[facility] with [facility] IUD. Pt has not had X-ray to r/o IUD migration, I [facility] her to [facility] have [facility] I also wanted to confirm okay to schedule anytime? She doesn't need to wait until after a cycle correct? Please advise

## 2017-04-02 NOTE — Telephone Encounter (Signed)
Agree with Abdominopelvic X-Ray ASAP first.  Sonohysto after that, anytime as long as not having heavy bleeding.

## 2017-04-02 NOTE — Telephone Encounter (Signed)
Pt informed

## 2017-04-04 ENCOUNTER — Ambulatory Visit (HOSPITAL_COMMUNITY)
Admission: RE | Admit: 2017-04-04 | Discharge: 2017-04-04 | Disposition: A | Discharge status: Home / Self Care | Payer: 59 | Source: Ambulatory Visit | Attending: Obstetrics & Gynecology | Admitting: Obstetrics & Gynecology

## 2017-04-04 DIAGNOSIS — T8389XA Other specified complication of genitourinary prosthetic devices, implants and grafts, initial encounter: Secondary | ICD-10-CM | POA: Insufficient documentation

## 2017-04-04 DIAGNOSIS — X58XXXA Exposure to other specified factors, initial encounter: Secondary | ICD-10-CM | POA: Diagnosis not present

## 2017-04-04 DIAGNOSIS — T8332XA Displacement of intrauterine contraceptive device, initial encounter: Secondary | ICD-10-CM

## 2017-04-05 NOTE — Telephone Encounter (Signed)
X-ray done on 04/04/17

## 2017-04-16 ENCOUNTER — Other Ambulatory Visit: Payer: Self-pay | Admitting: Obstetrics & Gynecology

## 2017-04-16 DIAGNOSIS — N939 Abnormal uterine and vaginal bleeding, unspecified: Secondary | ICD-10-CM

## 2017-04-29 ENCOUNTER — Encounter: Payer: Self-pay | Admitting: Obstetrics & Gynecology

## 2017-04-29 ENCOUNTER — Ambulatory Visit (INDEPENDENT_AMBULATORY_CARE_PROVIDER_SITE_OTHER): Payer: 59

## 2017-04-29 ENCOUNTER — Ambulatory Visit (INDEPENDENT_AMBULATORY_CARE_PROVIDER_SITE_OTHER): Payer: 59 | Admitting: Obstetrics & Gynecology

## 2017-04-29 DIAGNOSIS — N939 Abnormal uterine and vaginal bleeding, unspecified: Secondary | ICD-10-CM

## 2017-04-29 DIAGNOSIS — Z30015 Encounter for initial prescription of vaginal ring hormonal contraceptive: Secondary | ICD-10-CM | POA: Diagnosis not present

## 2017-04-29 DIAGNOSIS — R9389 Abnormal findings on diagnostic imaging of other specified body structures: Secondary | ICD-10-CM | POA: Diagnosis not present

## 2017-04-29 DIAGNOSIS — N921 Excessive and frequent menstruation with irregular cycle: Secondary | ICD-10-CM

## 2017-04-29 DIAGNOSIS — N926 Irregular menstruation, unspecified: Secondary | ICD-10-CM | POA: Diagnosis not present

## 2017-04-29 DIAGNOSIS — Z975 Presence of (intrauterine) contraceptive device: Secondary | ICD-10-CM | POA: Diagnosis not present

## 2017-04-29 LAB — PREGNANCY, URINE: Preg Test, Ur: NEGATIVE

## 2017-04-29 MED ORDER — ETONOGESTREL-ETHINYL ESTRADIOL 0.12-0.015 MG/24HR VA RING: 1.0000 | VAGINAL_RING | VAGINAL | 4 refills | Status: DC

## 2017-04-29 NOTE — Patient Instructions (Signed)
1. Thickened endometrium Endometrium normal today at 4.3 mm (excluding the fluid).  No IU lesion seen on Sonohystero.  Reassured.  2. Late menses UPT neg. - Pregnancy, urine  3. Encounter for initial prescription of vaginal ring hormonal contraceptive Contraceptive methods reviewed.  Would like to have contraception and control her periods as well.  BCPs, Patch, Ring, Nexplanon, DepoProvera and Progestin IUD as options.  Progestin IUD not recommended as she expulsed the last one, confirmed by an XRay of Abdomen/Pelvis not showing the IUD.  Decision to start on Nuvaring.  Continuous usage instructed.  Usage/Risks/Benefits reviewed.  Patient agrees.  Prescription sent.  Hailley, it was a pleasure seeing you today!  Ethinyl Estradiol; Etonogestrel vaginal ring What is this medicine? ETHINYL ESTRADIOL; ETONOGESTREL (ETH in il es tra DYE ole; et oh noe JES trel) vaginal ring is a flexible, vaginal ring used as a contraceptive (birth control method). This medicine combines two types of female hormones, an estrogen and a progestin. This ring is used to prevent ovulation and pregnancy. Each ring is effective for one month. This medicine may be used for other purposes; ask your health care provider or pharmacist if you have questions. COMMON BRAND NAME(S): NuvaRing What should I tell my health care provider before I take this medicine? They need to know if you have or ever had any of these conditions: -abnormal vaginal bleeding -blood vessel disease or blood clots -breast, cervical, endometrial, ovarian, liver, or uterine cancer -diabetes -gallbladder disease -heart disease or recent heart attack -high blood pressure -high cholesterol -kidney disease -liver disease -migraine headaches -stroke -systemic lupus erythematosus (SLE) -tobacco smoker -an unusual or allergic reaction to estrogens, progestins, other medicines, foods, dyes, or preservatives -pregnant or trying to get  pregnant -breast-feeding How should I use this medicine? Insert the ring into your vagina as directed. Follow the directions on the prescription label. The ring will remain place for 3 weeks and is then removed for a 1-week break. A new ring is inserted 1 week after the last ring was removed, on the same day of the week. Check often to make sure the ring is still in place, especially before and after sexual intercourse. If the ring was out of the vagina for an unknown amount of time, you may not be protected from pregnancy. Perform a pregnancy test and call your doctor. Do not use more often than directed. A patient package insert for the product will be given with each prescription and refill. Read this sheet carefully each time. The sheet may change frequently. Contact your pediatrician regarding the use of this medicine in children. Special care may be needed. This medicine has been used in female children who have started having menstrual periods. Overdosage: If you think you have taken too much of this medicine contact a poison control center or emergency room at once. NOTE: This medicine is only for you. Do not share this medicine with others. What if I miss a dose? You will need to replace your vaginal ring once a month as directed. If the ring should slip out, or if you leave it in longer or shorter than you should, contact your health care professional for advice. What may interact with this medicine? Do not take this medicine with the following medication: -dasabuvir; ombitasvir; paritaprevir; ritonavir -ombitasvir; paritaprevir; ritonavir This medicine may also interact with the following medications: -acetaminophen -antibiotics or medicines for infections, especially rifampin, rifabutin, rifapentine, and griseofulvin, and possibly penicillins or tetracyclines -aprepitant -ascorbic acid (vitamin C) -atorvastatin -  barbiturate medicines, such as  phenobarbital -bosentan -carbamazepine -caffeine -clofibrate -cyclosporine -dantrolene -doxercalciferol -felbamate -grapefruit juice -hydrocortisone -medicines for anxiety or sleeping problems, such as diazepam or temazepam -medicines for diabetes, including pioglitazone -modafinil -mycophenolate -nefazodone -oxcarbazepine -phenytoin -prednisolone -ritonavir or other medicines for HIV infection or AIDS -rosuvastatin -selegiline -soy isoflavones supplements -St. John's wort -tamoxifen or raloxifene -theophylline -thyroid hormones -topiramate -warfarin This list may not describe all possible interactions. Give your health care provider a list of all the medicines, herbs, non-prescription drugs, or dietary supplements you use. Also tell them if you smoke, drink alcohol, or use illegal drugs. Some items may interact with your medicine. What should I watch for while using this medicine? Visit your doctor or health care professional for regular checks on your progress. You will need a regular breast and pelvic exam and Pap smear while on this medicine. Use an additional method of contraception during the first cycle that you use this ring. Do not use a diaphragm or female condom, as the ring can interfere with these birth control methods and their proper placement. If you have any reason to think you are pregnant, stop using this medicine right away and contact your doctor or health care professional. If you are using this medicine for hormone related problems, it may take several cycles of use to see improvement in your condition. Smoking increases the risk of getting a blood clot or having a stroke while you are using hormonal birth control, especially if you are more than 44 years old. You are strongly advised not to smoke. This medicine can make your body retain fluid, making your fingers, hands, or ankles swell. Your blood pressure can go up. Contact your doctor or health care  professional if you feel you are retaining fluid. This medicine can make you more sensitive to the sun. Keep out of the sun. If you cannot avoid being in the sun, wear protective clothing and use sunscreen. Do not use sun lamps or tanning beds/booths. If you wear contact lenses and notice visual changes, or if the lenses begin to feel uncomfortable, consult your eye care specialist. In some women, tenderness, swelling, or minor bleeding of the gums may occur. Notify your dentist if this happens. Brushing and flossing your teeth regularly may help limit this. See your dentist regularly and inform your dentist of the medicines you are taking. If you are going to have elective surgery, you may need to stop using this medicine before the surgery. Consult your health care professional for advice. This medicine does not protect you against HIV infection (AIDS) or any other sexually transmitted diseases. What side effects may I notice from receiving this medicine? Side effects that you should report to your doctor or health care professional as soon as possible: -breast tissue changes or discharge -changes in vaginal bleeding during your period or between your periods -chest pain -coughing up blood -dizziness or fainting spells -headaches or migraines -leg, arm or groin pain -severe or sudden headaches -stomach pain (severe) -sudden shortness of breath -sudden loss of coordination, especially on one side of the body -speech problems -symptoms of vaginal infection like itching, irritation or unusual discharge -tenderness in the upper abdomen -vomiting -weakness or numbness in the arms or legs, especially on one side of the body -yellowing of the eyes or skin Side effects that usually do not require medical attention (report to your doctor or health care professional if they continue or are bothersome): -breakthrough bleeding and spotting that continues beyond the  3 initial cycles of pills -breast  tenderness -mood changes, anxiety, depression, frustration, anger, or emotional outbursts -increased sensitivity to sun or ultraviolet light -nausea -skin rash, acne, or brown spots on the skin -weight gain (slight) This list may not describe all possible side effects. Call your doctor for medical advice about side effects. You may report side effects to FDA at 1-800-FDA-1088. Where should I keep my medicine? Keep out of the reach of children. Store at room temperature between 15 and 30 degrees C (59 and 86 degrees F) for up to 4 months. The product will expire after 4 months. Protect from light. Throw away any unused medicine after the expiration date. NOTE: This sheet is a summary. It may not cover all possible information. If you have questions about this medicine, talk to your doctor, pharmacist, or health care provider.  2018 Elsevier/Gold Standard (2016-02-03 17:00:31)

## 2017-04-29 NOTE — Progress Notes (Signed)
    Shawna White 06/09/1971 [id number]        46 y.o.  G3P1003   RP:  Thickened Endometrium for Sonohystero  HPI:  Endometrial line at 11.4 mm with pos CFD.  X-Ray of Abdomen and Pelvis 04/04/2017 Normal, no IUD seen.  Had a heavy menstrual period on 03/29/2017.  Past medical history,surgical history, problem list, medications, allergies, family history and social history were all reviewed and documented in the EPIC chart.  Directed ROS with pertinent positives and negatives documented in the history of present illness/assessment and plan.  Exam:  There were no vitals filed for this visit. General appearance:  Normal  UPT neg                                                                    Sono Infusion Hysterogram ( procedure note)   The initial transvaginal ultrasound demonstrated the following:  T/V Uterus anteverted and homogeneous measuring 9.75 x 6.13 x 4.94 cm.  The endometrial line is at 4.3 mm, excluding the fluid.  Right ovary normal.  Left ovary normal.  Left adnexal tubular cystic echo free mass unchanged since last ultrasound measuring 7.8 x 3.0 cm.  No free fluid in posterior cul-de-sac.  The speculum  was inserted and the cervix cleansed with Betadine solution after confirming that patient has no allergies.A small sonohysterography catheterwas utilized.  Insertion was facilitated with ring forceps, using a spear-like motion the catheter was inserted to the fundus of the uterus. The speculum is then removed carefully to avoid dislodging the catheter. The catheter was flushed with sterile saline delete prior to insertion to rid it of small amounts of air.the sterile saline solution was infused into the uterine cavity as a vaginal ultrasound probe was then placed in the vagina for full visualization of the uterine cavity from a transvaginal approach.   The following was noted: Following injection of saline, endometrium filled with no defect seen.  The catheter was then  removed after retrieving some of the saline from the intrauterine cavity. An endometrial biopsy was not done. Patient tolerated procedure well. She had received a tablet of Aleve for discomfort.    Assessment/Plan:  46 y.o. G3P1003   1. Thickened endometrium Endometrium normal today at 4.3 mm (excluding the fluid).  No IU lesion seen on Sonohystero.  Reassured.  2. Late menses UPT neg. - Pregnancy, urine  3. Encounter for initial prescription of vaginal ring hormonal contraceptive Contraceptive methods reviewed.  Would like to have contraception and control her periods as well.  BCPs, Patch, Ring, Nexplanon, DepoProvera and Progestin IUD as options.  Progestin IUD not recommended as she expulsed the last one, confirmed by an XRay of Abdomen/Pelvis not showing the IUD.  Decision to start on Nuvaring.  Continuous usage instructed.  Usage/Risks/Benefits reviewed.  Patient agrees.  Prescription sent.  Counseling on above issues >50% x 15 minutes.  Genia DelMarie-Lyne Kassy Mcenroe MD, 4:13 PM 04/29/2017

## 2017-05-16 ENCOUNTER — Other Ambulatory Visit: Payer: Self-pay

## 2017-06-13 ENCOUNTER — Telehealth: Payer: Self-pay | Admitting: *Deleted

## 2017-06-13 NOTE — Telephone Encounter (Signed)
(  pt aware you are out of the office) Patient never started nuvaring Rx in 04/29/17, she continued Junel 24 FE, wishes not to use nuvaring.  insurance will not pay for medication. She did say they will cover Junel FE 1/20 asked if this is option?

## 2017-06-17 MED ORDER — NORETHIN ACE-ETH ESTRAD-FE 1-20 MG-MCG PO TABS
1.0000 | ORAL_TABLET | Freq: Every day | ORAL | 2 refills | Status: DC
Start: 2017-06-17 — End: 2018-03-02

## 2017-06-17 NOTE — Telephone Encounter (Signed)
Left on voicemail Rx sent 

## 2017-06-17 NOTE — Telephone Encounter (Signed)
Sure, can send a prescription for Junel FE 1/20 until next Annual/Gyn exam.

## 2017-11-04 IMAGING — US US THYROID BIOPSY
1 series · 13 of 15 positions shown · non-contrast
Comparison: Thyroid ultrasound performed 03/31/2016.

MEDICATIONS:
None

COMPLICATIONS:
None immediate.

INDICATION: Indeterminate right thyroid nodule

EXAM:
ULTRASOUND GUIDED FINE NEEDLE ASPIRATION OF INDETERMINATE RIGHT
THYROID NODULE
TECHNIQUE: Informed written consent was obtained from the patient after a
discussion of the risks, benefits and alternatives to treatment.
Questions regarding the procedure were encouraged and answered. A
timeout was performed prior to the initiation of the procedure.

[Series 1: us thyroid biopsy · 0.09mm/px · 15 acquisitions, 13 frames shown]
[im 1/15]
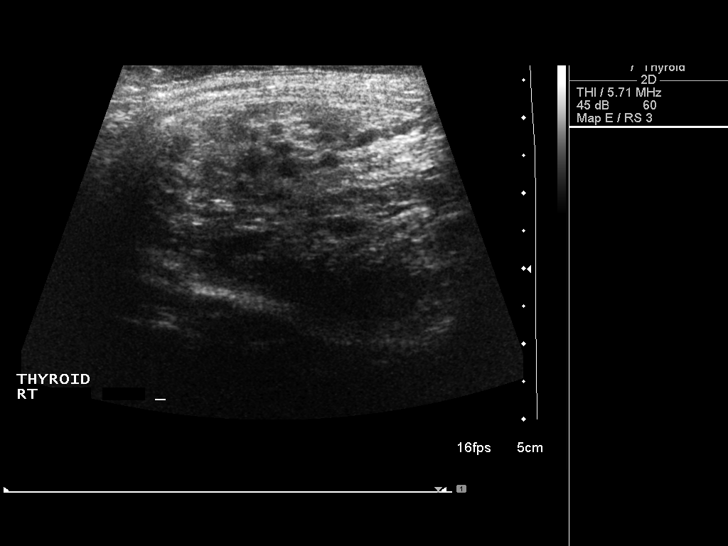
[im 2/15]
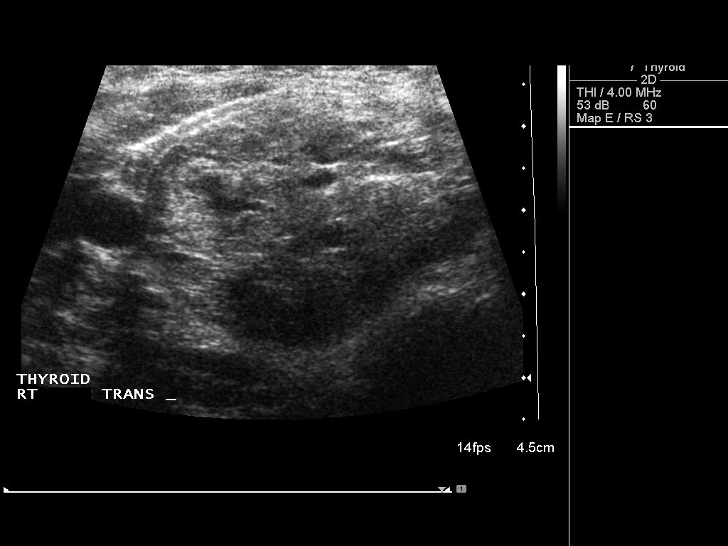
[im 3/15]
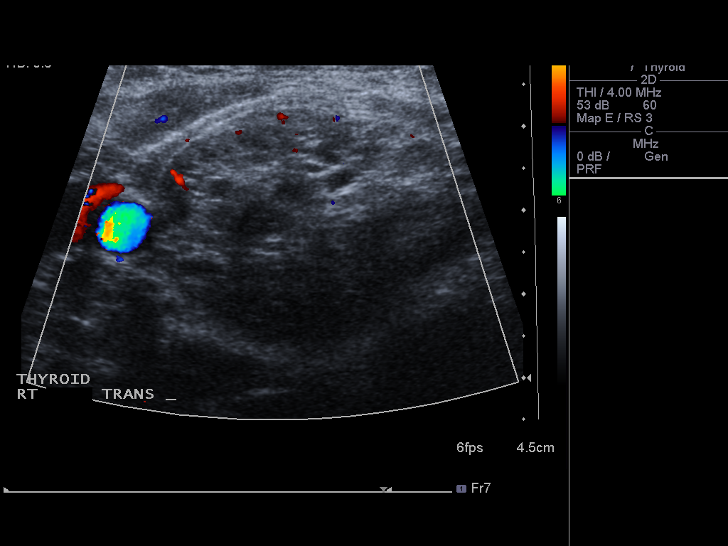
[im 5/15]
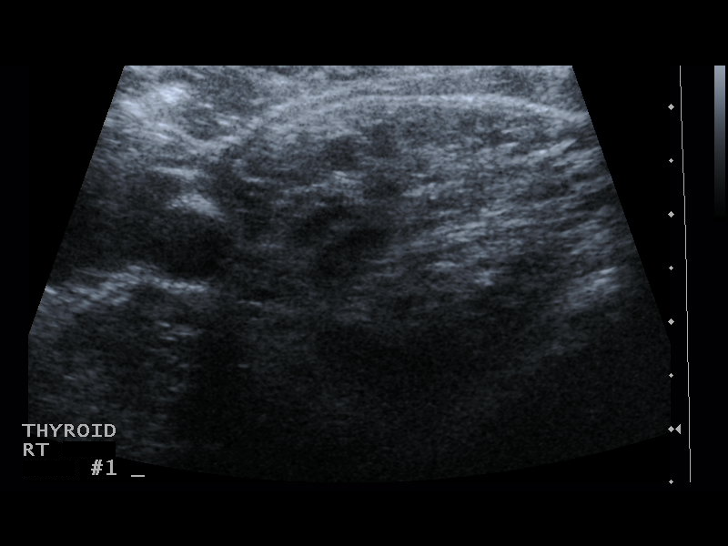
[im 6/15]
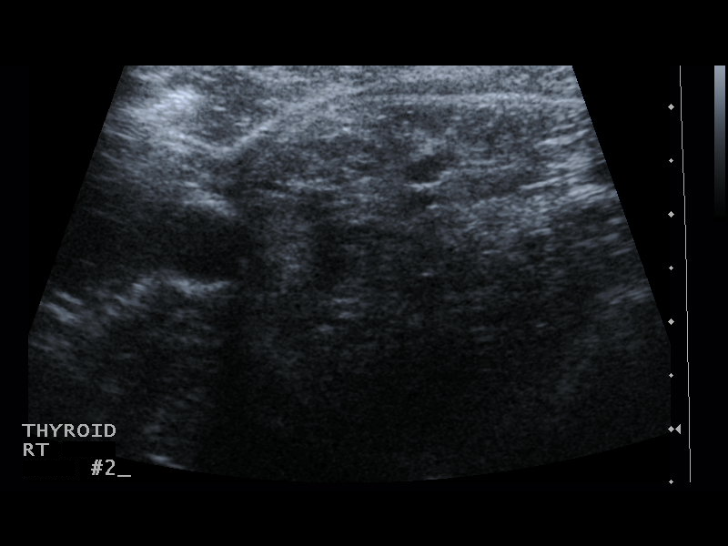
[im 7/15]
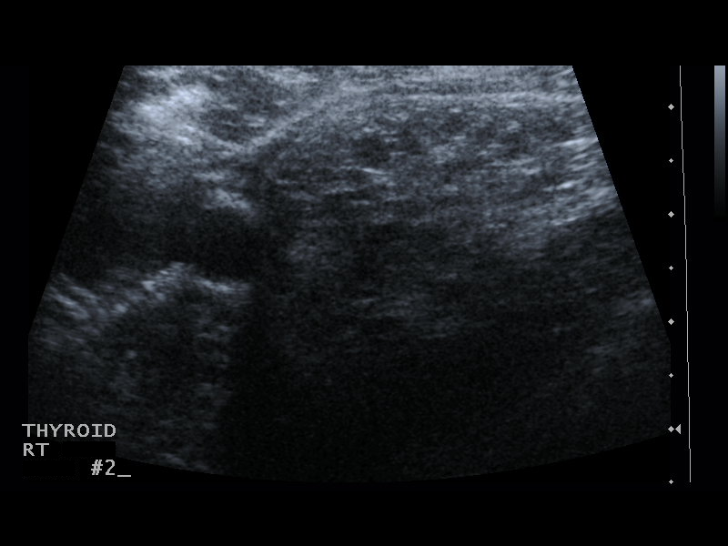
[im 8/15]
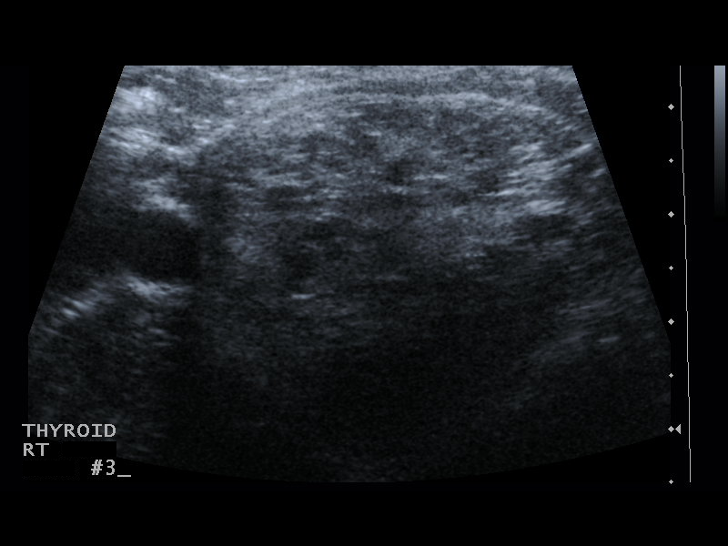
[im 9/15]
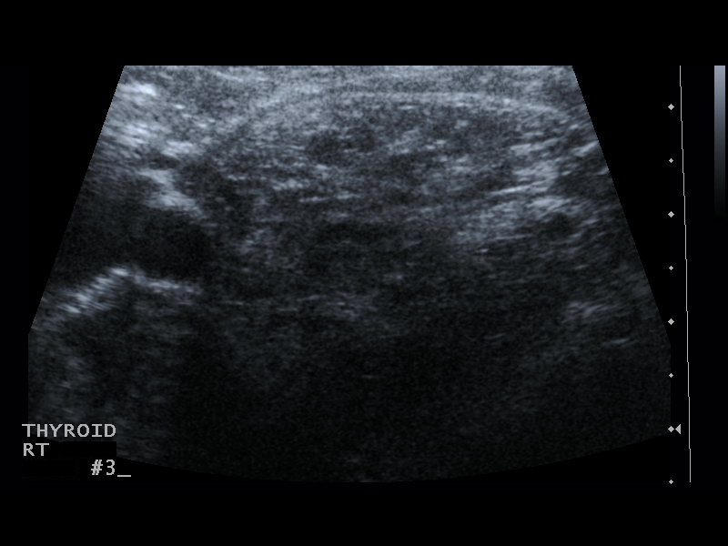
[im 10/15]
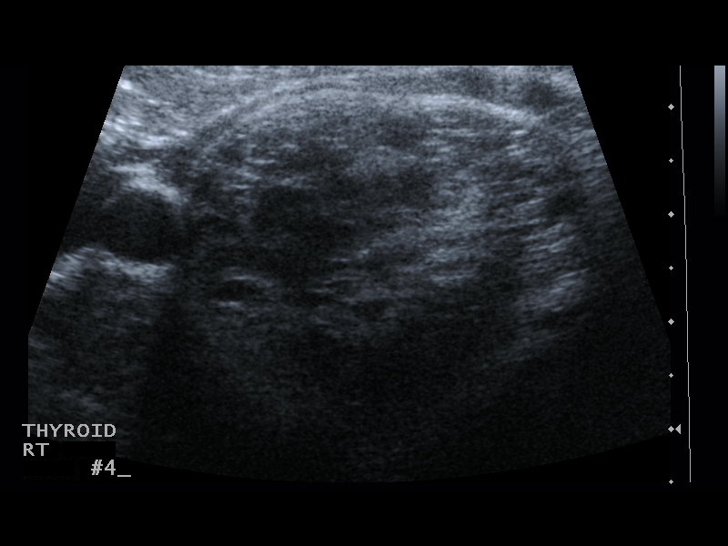
[im 11/15]
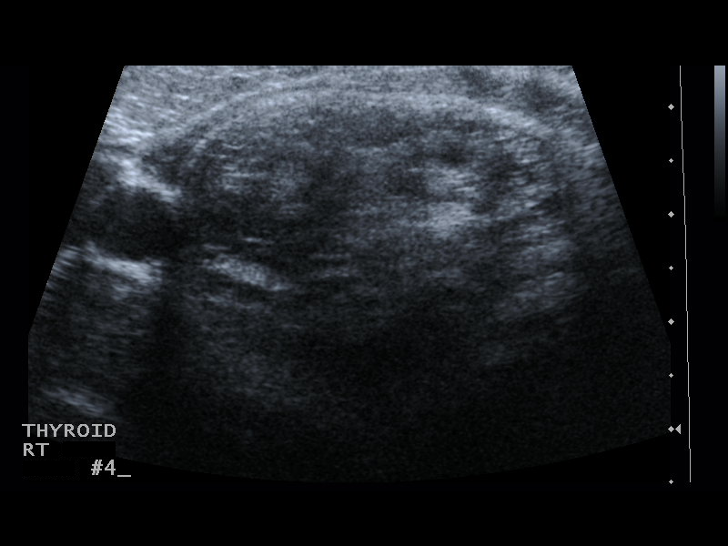
[im 13/15]
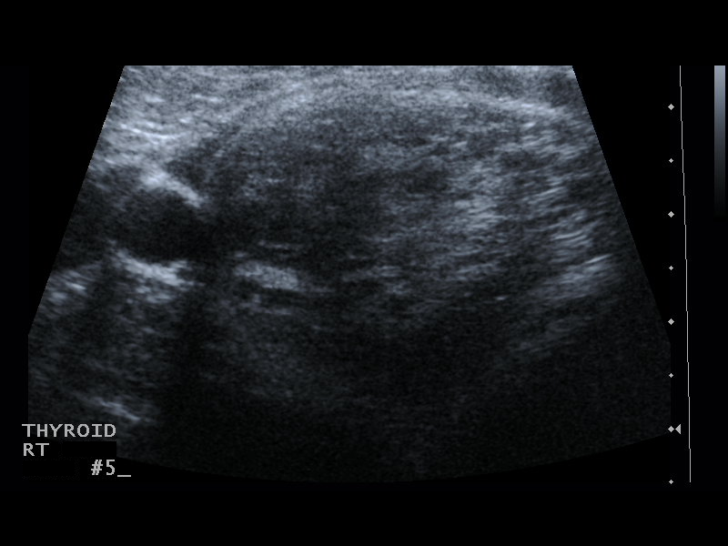
[im 14/15]
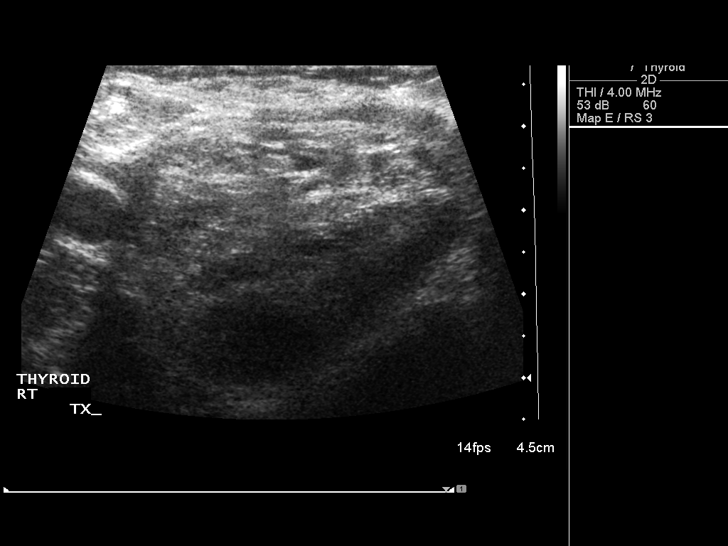
[im 15/15]
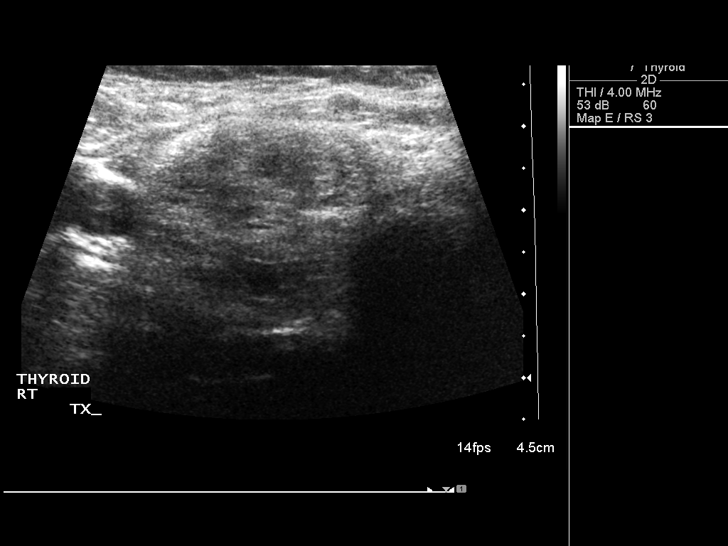

[13 of 15 positions shown; findings below may reference images not displayed]

Pre-procedural ultrasound scanning demonstrated unchanged size and
appearance of the indeterminate nodule within the right inferior
thyroid lobe.

The procedure was planned. The neck was prepped in the usual sterile
fashion, and a sterile drape was applied covering the operative
field. A timeout was performed prior to the initiation of the
procedure. Local anesthesia was provided with 1% lidocaine.

Under direct ultrasound guidance, 5 FNA biopsies were performed of
the right inferior thyroid nodule with a 25 gauge needle. Multiple
ultrasound images were saved for procedural documentation purposes.
The samples were prepared and submitted to pathology.

Limited post procedural scanning was negative for hematoma or
additional complication. Dressings were placed. The patient
tolerated the above procedures procedure well without immediate
postprocedural complication.
FINDINGS: FINDINGS
Nodule reference number based on prior diagnostic ultrasound: 1

Maximum size:  4.5 cm

Location: Right  ;  Inferior

ACR TI-RADS total points: 3

ACR TI-RADS risk category:  TR3 (3 points)

Prior biopsy:  No

Reason for biopsy: meets ACR TI-RADS criteria

Ultrasound imaging confirms appropriate placement of the needles
within the thyroid nodule.
IMPRESSION: Technically successful ultrasound guided fine needle aspiration of
right inferior thyroid nodule as described above.

## 2018-03-02 ENCOUNTER — Other Ambulatory Visit: Payer: Self-pay | Admitting: Obstetrics & Gynecology

## 2018-05-31 ENCOUNTER — Other Ambulatory Visit: Payer: Self-pay | Admitting: Obstetrics & Gynecology

## 2018-06-09 ENCOUNTER — Telehealth: Payer: Self-pay | Admitting: *Deleted

## 2018-06-09 MED ORDER — NORETHINDRONE 0.35 MG PO TABS: 1.0000 | ORAL_TABLET | Freq: Every day | ORAL | 0 refills | Status: DC

## 2018-06-09 NOTE — Telephone Encounter (Signed)
Patient has annual exam scheduled on 08/11/18, needs refill on birth control pill Rx sent.

## 2018-06-27 ENCOUNTER — Other Ambulatory Visit: Payer: Self-pay | Admitting: Obstetrics & Gynecology

## 2018-06-27 DIAGNOSIS — Z1231 Encounter for screening mammogram for malignant neoplasm of breast: Secondary | ICD-10-CM

## 2018-07-07 ENCOUNTER — Ambulatory Visit
Admission: RE | Admit: 2018-07-07 | Discharge: 2018-07-07 | Disposition: A | Discharge status: Home / Self Care | Payer: 59 | Source: Ambulatory Visit

## 2018-07-07 DIAGNOSIS — Z1231 Encounter for screening mammogram for malignant neoplasm of breast: Secondary | ICD-10-CM

## 2018-08-11 ENCOUNTER — Ambulatory Visit (INDEPENDENT_AMBULATORY_CARE_PROVIDER_SITE_OTHER): Payer: 59 | Admitting: Obstetrics & Gynecology

## 2018-08-11 ENCOUNTER — Encounter: Payer: Self-pay | Admitting: Obstetrics & Gynecology

## 2018-08-11 VITALS — BP 128/84 | Ht 64.0 in | Wt 189.0 lb

## 2018-08-11 DIAGNOSIS — E6609 Other obesity due to excess calories: Secondary | ICD-10-CM

## 2018-08-11 DIAGNOSIS — Z3041 Encounter for surveillance of contraceptive pills: Secondary | ICD-10-CM | POA: Diagnosis not present

## 2018-08-11 DIAGNOSIS — Z6832 Body mass index (BMI) 32.0-32.9, adult: Secondary | ICD-10-CM | POA: Diagnosis not present

## 2018-08-11 DIAGNOSIS — Z01419 Encounter for gynecological examination (general) (routine) without abnormal findings: Secondary | ICD-10-CM

## 2018-08-11 MED ORDER — NORETHINDRONE 0.35 MG PO TABS: 1.0000 | ORAL_TABLET | Freq: Every day | ORAL | 4 refills | Status: DC

## 2018-08-11 NOTE — Progress Notes (Signed)
Shawna White 12/23/1970 [id number]   History:    48 y.o. G3P3L3 married  RP:  Established patient presenting for annual gyn exam   HPI: Well on the Progestin-only BCP.  No heavy vaginal bleeding.  No pelvic pain.  No pain with intercourse.  Urine and bowel movements normal.  Breast normal.  Body mass index 32.44.  Needs to exercise more.  Health labs with family physician.  Past medical history,surgical history, family history and social history were all reviewed and documented in the EPIC chart.  Gynecologic History Patient's last menstrual period was 08/06/2018. Contraception: oral progesterone-only contraceptive Last Pap: 2017. Results were: normal Last mammogram: 06/2018. Results were: Negative Bone Density: Never Colonoscopy: Never  Obstetric History OB History  Gravida Para Term Preterm AB Living  3 3 1     3   SAB TAB Ectopic Multiple Live Births          1    # Outcome Date GA Lbr Len/2nd Weight Sex Delivery Anes PTL Lv  3 Term 01/21/11 [redacted]w[redacted]d 13:25 / 00:18 8 lb 3.6 oz (3.73 kg) F Vag-Spont EPI  LIV     Birth Comments: WNL  2 Para           1 Para              ROS: A ROS was performed and pertinent positives and negatives are included in the history.  GENERAL: No fevers or chills. HEENT: No change in vision, no earache, sore throat or sinus congestion. NECK: No pain or stiffness. CARDIOVASCULAR: No chest pain or pressure. No palpitations. PULMONARY: No shortness of breath, cough or wheeze. GASTROINTESTINAL: No abdominal pain, nausea, vomiting or diarrhea, melena or bright red blood per rectum. GENITOURINARY: No urinary frequency, urgency, hesitancy or dysuria. MUSCULOSKELETAL: No joint or muscle pain, no back pain, no recent trauma. DERMATOLOGIC: No rash, no itching, no lesions. ENDOCRINE: No polyuria, polydipsia, no heat or cold intolerance. No recent change in weight. HEMATOLOGICAL: No anemia or easy bruising or bleeding. NEUROLOGIC: No headache, seizures,  numbness, tingling or weakness. PSYCHIATRIC: No depression, no loss of interest in normal activity or change in sleep pattern.     Exam:   BP 128/84   Ht 5\' 4"  (1.626 m)   Wt 189 lb (85.7 kg)   LMP 08/06/2018   BMI 32.44 kg/m   Body mass index is 32.44 kg/m.  General appearance : Well developed well nourished female. No acute distress HEENT: Eyes: no retinal hemorrhage or exudates,  Neck supple, trachea midline, no carotid bruits, no thyroidmegaly Lungs: Clear to auscultation, no rhonchi or wheezes, or rib retractions  Heart: Regular rate and rhythm, no murmurs or gallops Breast:Examined in sitting and supine position were symmetrical in appearance, no palpable masses or tenderness,  no skin retraction, no nipple inversion, no nipple discharge, no skin discoloration, no axillary or supraclavicular lymphadenopathy Abdomen: no palpable masses or tenderness, no rebound or guarding Extremities: no edema or skin discoloration or tenderness  Pelvic: Vulva: Normal             Vagina: No gross lesions or discharge  Cervix: No gross lesions or discharge.  Pap reflex done.  Uterus  AV, normal size, shape and consistency, non-tender and mobile  Adnexa  Without masses or tenderness  Anus: Normal   Assessment/Plan:  48 y.o. female for annual exam   1. Encounter for routine gynecological examination with Papanicolaou smear of cervix Normal gynecologic exam.  Pap reflex done.  Breast exam normal.  Last screening mammogram January 2020 was negative.  Health labs with family physician. - Pap IG w/ reflex to HPV when ASC-U  2. Encounter for surveillance of contraceptive pills Continue with the progestin only pill.  No contraindication to continue.  Prescription sent to pharmacy.  3. Class 1 obesity due to excess calories without serious comorbidity with body mass index (BMI) of 32.0 to 32.9 in adult Recommend a lower calorie/carb diet such as [employer].  Aerobic physical activities 5  times a week and weightlifting every 2 days.  Other orders - norethindrone (MICRONOR,CAMILA,ERRIN) 0.35 MG tablet; Take 1 tablet (0.35 mg total) by mouth daily.  [name], 4:48 PM 08/11/2018

## 2018-08-13 LAB — PAP IG W/ RFLX HPV ASCU

## 2018-08-14 ENCOUNTER — Encounter: Payer: Self-pay | Admitting: *Deleted

## 2018-08-18 ENCOUNTER — Encounter: Payer: Self-pay | Admitting: Obstetrics & Gynecology

## 2018-08-18 NOTE — Patient Instructions (Signed)
  1. Encounter for routine gynecological examination with Papanicolaou smear of cervix Normal gynecologic exam.  Pap reflex done.  Breast exam normal.  Last screening mammogram January 2020 was negative.  Health labs with family physician. - Pap IG w/ reflex to HPV when ASC-U  2. Encounter for surveillance of contraceptive pills Continue with the progestin only pill.  No contraindication to continue.  Prescription sent to pharmacy.  3. Class 1 obesity due to excess calories without serious comorbidity with body mass index (BMI) of 32.0 to 32.9 in adult Recommend a lower calorie/carb diet such as [employer].  Aerobic physical activities 5 times a week and weightlifting every 2 days.  Other orders - norethindrone (MICRONOR,CAMILA,ERRIN) 0.35 MG tablet; Take 1 tablet (0.35 mg total) by mouth daily.  Page, it was a pleasure seeing you today!  I will inform you of your results as soon as they are available.

## 2019-08-18 ENCOUNTER — Other Ambulatory Visit: Payer: Self-pay

## 2019-08-18 MED ORDER — NORETHINDRONE 0.35 MG PO TABS: 1.0000 | ORAL_TABLET | Freq: Every day | ORAL | 0 refills | Status: DC

## 2019-08-18 NOTE — Telephone Encounter (Signed)
Toniann Fail will call patient to schedule CE since she is due now.

## 2019-10-08 ENCOUNTER — Encounter: Payer: 59 | Admitting: Obstetrics & Gynecology

## 2019-10-12 ENCOUNTER — Other Ambulatory Visit: Payer: Self-pay

## 2019-10-13 ENCOUNTER — Ambulatory Visit (INDEPENDENT_AMBULATORY_CARE_PROVIDER_SITE_OTHER): Payer: 59 | Admitting: Obstetrics & Gynecology

## 2019-10-13 ENCOUNTER — Encounter: Payer: Self-pay | Admitting: Obstetrics & Gynecology

## 2019-10-13 VITALS — BP 128/76 | Ht 64.0 in | Wt 199.0 lb

## 2019-10-13 DIAGNOSIS — Z01419 Encounter for gynecological examination (general) (routine) without abnormal findings: Secondary | ICD-10-CM

## 2019-10-13 DIAGNOSIS — Z3041 Encounter for surveillance of contraceptive pills: Secondary | ICD-10-CM

## 2019-10-13 DIAGNOSIS — Z6834 Body mass index (BMI) 34.0-34.9, adult: Secondary | ICD-10-CM

## 2019-10-13 DIAGNOSIS — E6609 Other obesity due to excess calories: Secondary | ICD-10-CM

## 2019-10-13 MED ORDER — NORETHINDRONE 0.35 MG PO TABS: 1.0000 | ORAL_TABLET | Freq: Every day | ORAL | 4 refills | Status: DC

## 2019-10-13 NOTE — Progress Notes (Signed)
Shawna White Zayanna Pundt 1970/12/07 [id number]   History:    49 y.o. G3P3L3 married.  Daughter 3rd grade, son 5th grade and daughter 9th grade.  RP:  Established patient presenting for annual gyn exam   HPI: Well on the Progestin-only BCP.  Rare menses.  No pelvic pain.  No pain with intercourse.  Urine and bowel movements normal.  Breast normal.  Body mass index 34.16.  Needs to exercise more.  Health labs with family physician.  Past medical history,surgical history, family history and social history were all reviewed and documented in the EPIC chart.  Gynecologic History Patient's last menstrual period was 10/03/2019.  Obstetric History OB History  Gravida Para Term Preterm AB Living  3 3 1     3   SAB TAB Ectopic Multiple Live Births          1    # Outcome Date GA Lbr Len/2nd Weight Sex Delivery Anes PTL Lv  3 Term 01/21/11 [redacted]w[redacted]d 13:25 / 00:18 8 lb 3.6 oz (3.73 kg) F Vag-Spont EPI  LIV     Birth Comments: WNL  2 Para           1 Para              ROS: A ROS was performed and pertinent positives and negatives are included in the history.  GENERAL: No fevers or chills. HEENT: No change in vision, no earache, sore throat or sinus congestion. NECK: No pain or stiffness. CARDIOVASCULAR: No chest pain or pressure. No palpitations. PULMONARY: No shortness of breath, cough or wheeze. GASTROINTESTINAL: No abdominal pain, nausea, vomiting or diarrhea, melena or bright red blood per rectum. GENITOURINARY: No urinary frequency, urgency, hesitancy or dysuria. MUSCULOSKELETAL: No joint or muscle pain, no back pain, no recent trauma. DERMATOLOGIC: No rash, no itching, no lesions. ENDOCRINE: No polyuria, polydipsia, no heat or cold intolerance. No recent change in weight. HEMATOLOGICAL: No anemia or easy bruising or bleeding. NEUROLOGIC: No headache, seizures, numbness, tingling or weakness. PSYCHIATRIC: No depression, no loss of interest in normal activity or change in sleep pattern.       Exam:   BP 128/76   Ht 5\' 4"  (1.626 m)   Wt 199 lb (90.3 kg)   LMP 10/03/2019 Comment: PILL  BMI 34.16 kg/m   Body mass index is 34.16 kg/m.  General appearance : Well developed well nourished female. No acute distress HEENT: Eyes: no retinal hemorrhage or exudates,  Neck supple, trachea midline, no carotid bruits, no thyroidmegaly Lungs: Clear to auscultation, no rhonchi or wheezes, or rib retractions  Heart: Regular rate and rhythm, no murmurs or gallops Breast:Examined in sitting and supine position were symmetrical in appearance, no palpable masses or tenderness,  no skin retraction, no nipple inversion, no nipple discharge, no skin discoloration, no axillary or supraclavicular lymphadenopathy Abdomen: no palpable masses or tenderness, no rebound or guarding Extremities: no edema or skin discoloration or tenderness  Pelvic: Vulva: Normal             Vagina: No gross lesions or discharge  Cervix: No gross lesions or discharge  Uterus  AV, normal size, shape and consistency, non-tender and mobile  Adnexa  Without masses or tenderness  Anus: Normal   Assessment/Plan:  49 y.o. female for annual exam   1. Well female exam with routine gynecological exam Normal gynecologic exam.  Pap test Negative 08/2018, no indication to repeat a Pap test this year.  Breast exam normal.  Will schedule screening  Bibb labs with Fam MD.  2. Encounter for surveillance of contraceptive pills Well on the Progestin-only BCPs.  No CI to continue.  Prescription sent to pharmacy.  3. Class 1 obesity due to excess calories without serious comorbidity with body mass index (BMI) of 34.0 to 34.9 in adult Recommend a lower calorie/carb diet such as [employer].  Aerobic activities 5 times a week and light weight lifting every 2 days.  Other orders - norethindrone (MICRONOR) 0.35 MG tablet; Take 1 tablet (0.35 mg total) by mouth daily.  Princess Bruins MD, 4:22 PM 10/13/2019

## 2019-10-13 NOTE — Patient Instructions (Signed)
1. Well female exam with routine gynecological exam Normal gynecologic exam.  Pap test Negative 08/2018, no indication to repeat a Pap test this year.  Breast exam normal.  Will schedule screening Mammo.  Health labs with Fam MD.  2. Encounter for surveillance of contraceptive pills Well on the Progestin-only BCPs.  No CI to continue.  Prescription sent to pharmacy.  3. Class 1 obesity due to excess calories without serious comorbidity with body mass index (BMI) of 34.0 to 34.9 in adult Recommend a lower calorie/carb diet such as [employer].  Aerobic activities 5 times a week and light weight lifting every 2 days.  Other orders - norethindrone (MICRONOR) 0.35 MG tablet; Take 1 tablet (0.35 mg total) by mouth daily.  Page, it was a pleasure seeing you today!

## 2019-11-19 ENCOUNTER — Other Ambulatory Visit: Payer: Self-pay

## 2019-11-19 MED ORDER — NORETHINDRONE 0.35 MG PO TABS: 1.0000 | ORAL_TABLET | Freq: Every day | ORAL | 3 refills | Status: AC

## 2020-01-29 IMAGING — MG DIGITAL SCREENING BILATERAL MAMMOGRAM WITH TOMO AND CAD
8 series · 8 of 24 positions shown · non-contrast
Comparison: Previous exam(s).

CLINICAL DATA: Screening.

EXAM:
DIGITAL SCREENING BILATERAL MAMMOGRAM WITH TOMO AND CAD

[L CC synth-2D]
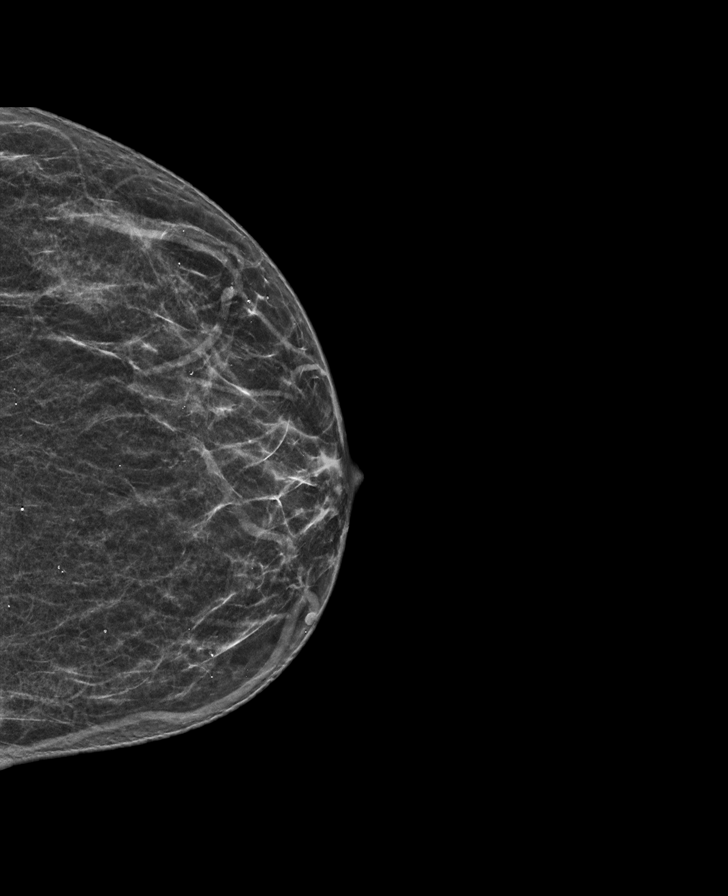

[R CC synth-2D]
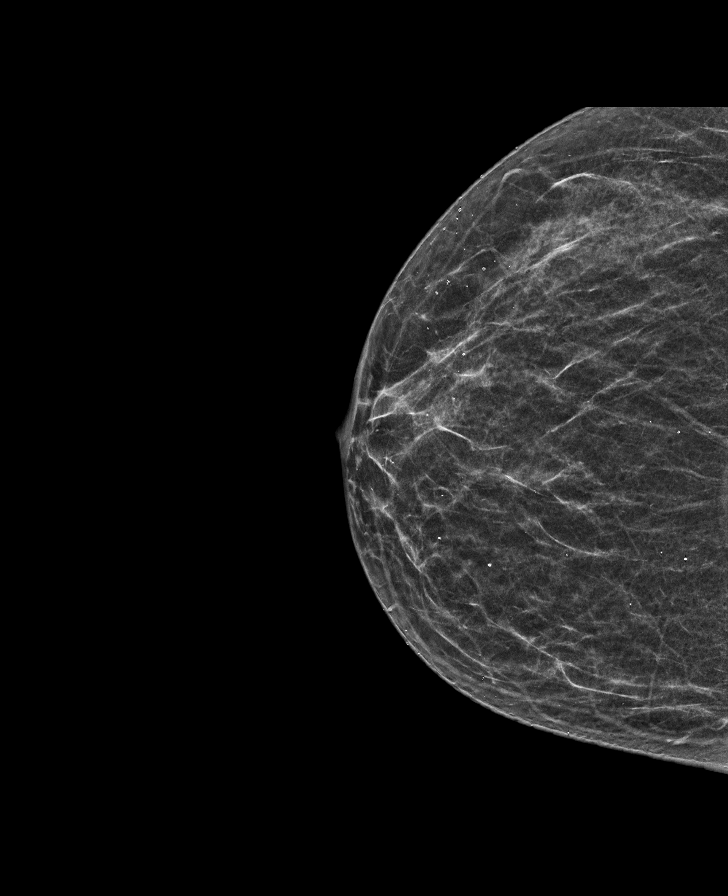

[L MLO synth-2D]
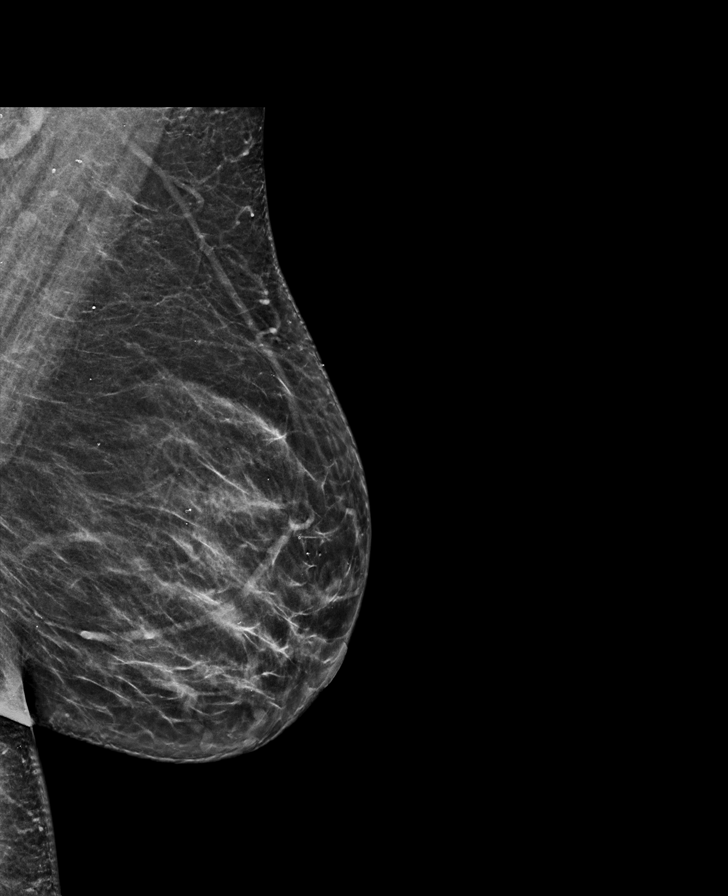

[R MLO synth-2D]
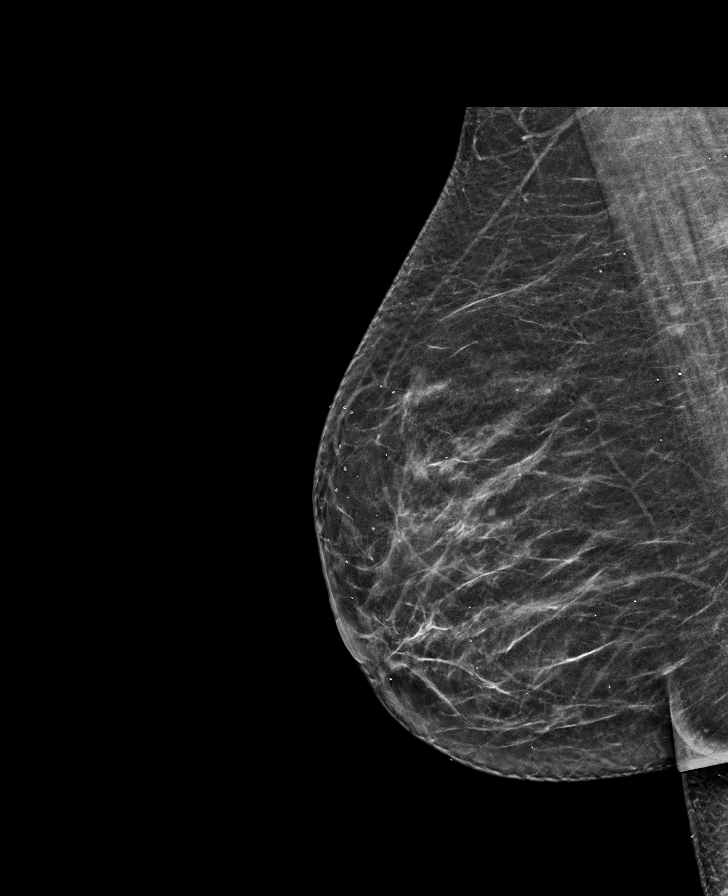

[L MLO tomo · tomo slice 33/66.0]
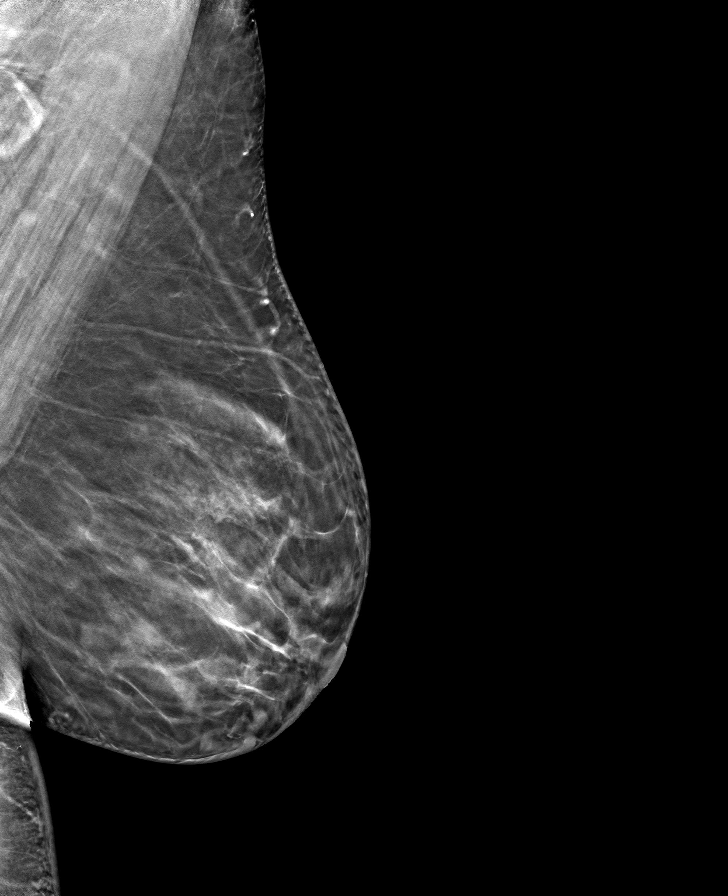

[R MLO tomo · tomo slice 33/66.0]
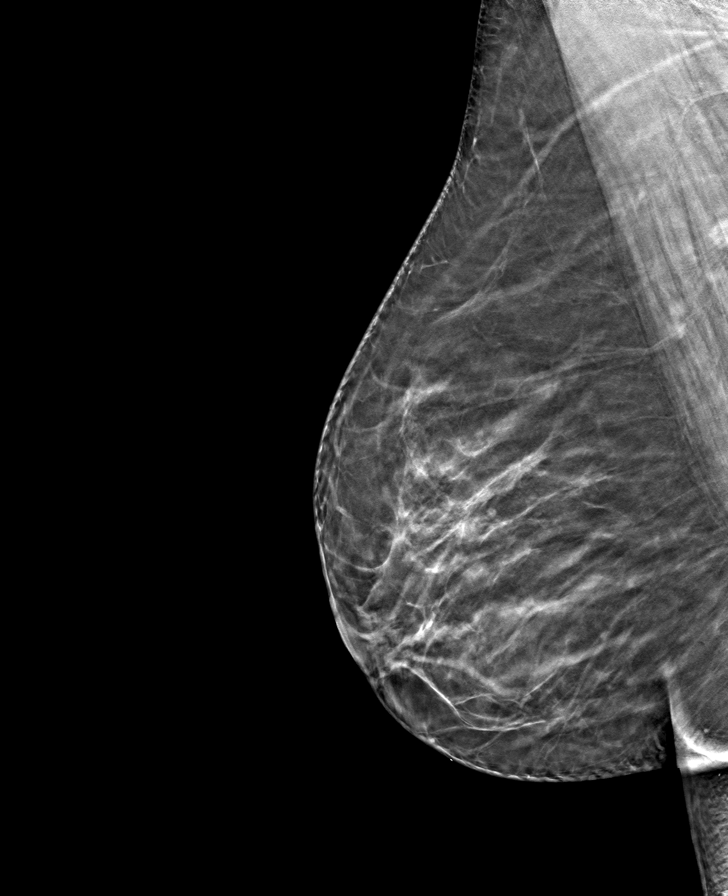

[R CC tomo · tomo slice 27/52.0]
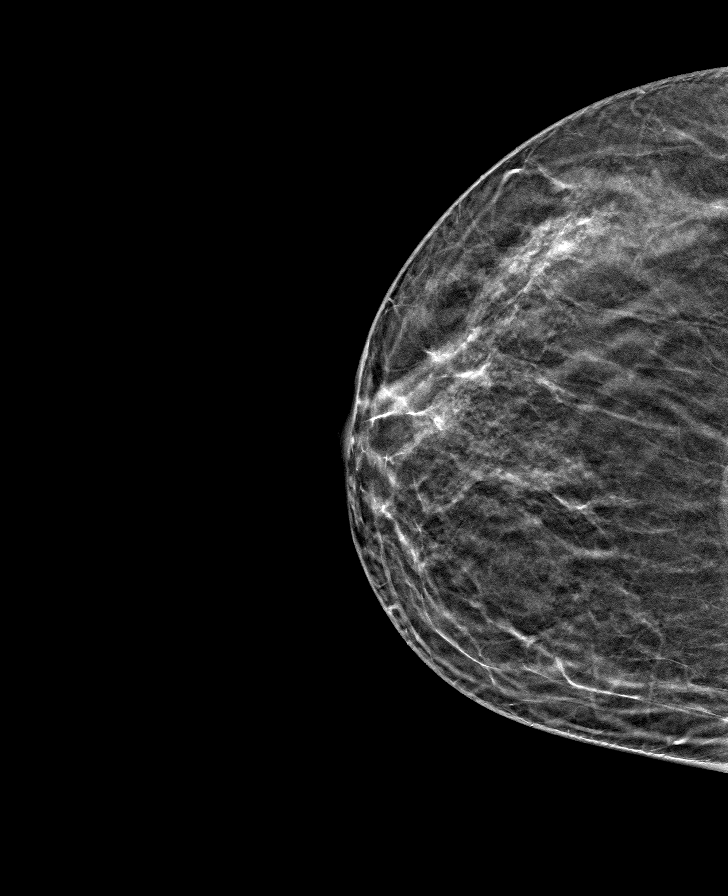

[L CC tomo · tomo slice 26/51.0]
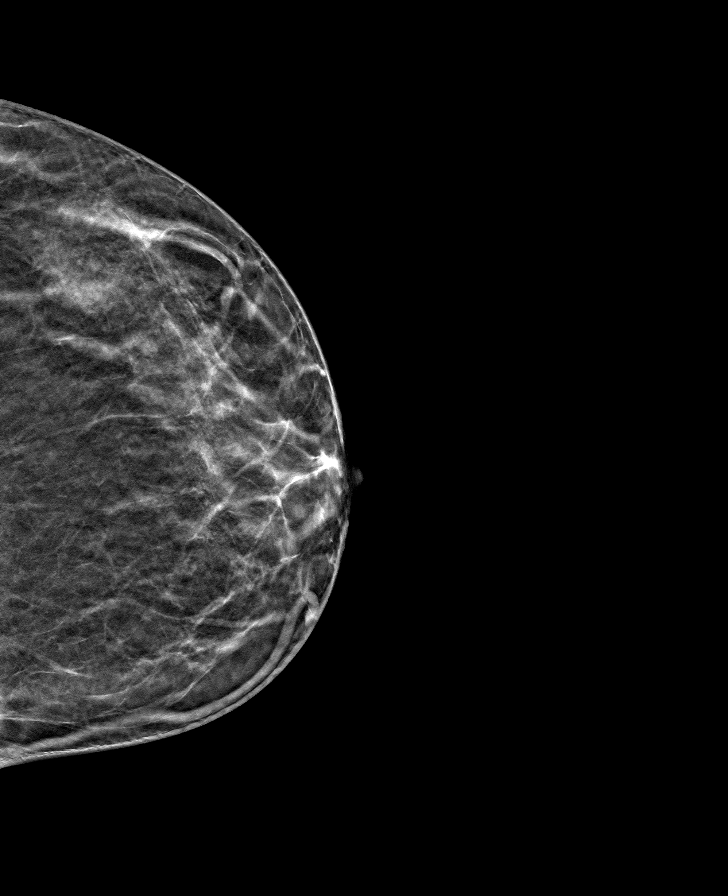

[8 of 24 positions shown; findings below may reference images not displayed]

ACR Breast Density Category b: There are scattered areas of
fibroglandular density.
FINDINGS: There are no findings suspicious for malignancy. Images were
processed with CAD.
IMPRESSION: No mammographic evidence of malignancy. A result letter of this
screening mammogram will be mailed directly to the patient.

RECOMMENDATION:
Screening mammogram in one year. (Code:[id number])

BI-RADS CATEGORY  1: Negative.

## 2021-01-26 ENCOUNTER — Other Ambulatory Visit: Payer: Self-pay | Admitting: Obstetrics & Gynecology

## 2021-07-17 ENCOUNTER — Other Ambulatory Visit: Payer: Self-pay | Admitting: Obstetrics & Gynecology

## 2021-07-17 DIAGNOSIS — Z1231 Encounter for screening mammogram for malignant neoplasm of breast: Secondary | ICD-10-CM

## 2021-07-19 ENCOUNTER — Ambulatory Visit
Admission: RE | Admit: 2021-07-19 | Discharge: 2021-07-19 | Disposition: A | Discharge status: Home / Self Care | Payer: 59 | Source: Ambulatory Visit | Attending: Obstetrics & Gynecology | Admitting: Obstetrics & Gynecology

## 2021-07-19 DIAGNOSIS — Z1231 Encounter for screening mammogram for malignant neoplasm of breast: Secondary | ICD-10-CM

## 2023-05-02 ENCOUNTER — Other Ambulatory Visit: Payer: Self-pay | Admitting: Obstetrics & Gynecology

## 2023-05-02 DIAGNOSIS — Z1231 Encounter for screening mammogram for malignant neoplasm of breast: Secondary | ICD-10-CM

## 2023-05-08 ENCOUNTER — Ambulatory Visit
Admission: RE | Admit: 2023-05-08 | Discharge: 2023-05-08 | Disposition: A | Discharge status: Home / Self Care | Payer: BC Managed Care – PPO | Source: Ambulatory Visit

## 2023-05-08 DIAGNOSIS — Z1231 Encounter for screening mammogram for malignant neoplasm of breast: Secondary | ICD-10-CM

## 2023-10-03 ENCOUNTER — Other Ambulatory Visit: Payer: Self-pay

## 2023-10-03 ENCOUNTER — Ambulatory Visit
Admission: EM | Admit: 2023-10-03 | Discharge: 2023-10-03 | Disposition: A | Discharge status: Home / Self Care | Attending: Physician Assistant | Admitting: Physician Assistant

## 2023-10-03 DIAGNOSIS — L299 Pruritus, unspecified: Secondary | ICD-10-CM | POA: Diagnosis not present

## 2023-10-03 DIAGNOSIS — R197 Diarrhea, unspecified: Secondary | ICD-10-CM

## 2023-10-03 DIAGNOSIS — R112 Nausea with vomiting, unspecified: Secondary | ICD-10-CM

## 2023-10-03 MED ORDER — ONDANSETRON 4 MG PO TBDP: 4.0000 mg | ORAL_TABLET | Freq: Three times a day (TID) | ORAL | 0 refills | Status: AC | PRN

## 2023-10-03 MED ORDER — ALUM & MAG HYDROXIDE-SIMETH 200-200-20 MG/5ML PO SUSP
30.0000 mL | Freq: Once | ORAL | Status: AC
  Administered 2023-10-03: 30 mL via ORAL

## 2023-10-03 NOTE — ED Provider Notes (Signed)
 [facility]    CSN: [id number] Arrival date & time: 10/03/23  1435      History   Chief Complaint Chief Complaint  Patient presents with   Abdominal Cramping    HPI Shawna White Yearsley is a 53 y.o. female.   HPI  She reports about 2 hours ago she started to have severe nausea, vomiting and diarrhea as well as flushing She states she was also having globus sensation when trying to swallow  She reports she was having significant skin redness and severe itching  She reports these symptoms started to resolve about 30 minutes ago but she is still having some abdominal pain that is more mild than it was previously  She denies new foods, medications She states she did have some pimento cheese today about 45-60 minutes prior to symptoms starting but she had the same yesterday without issues  Interventions: none prior to arrival    History reviewed. No pertinent past medical history.  Patient Active Problem List   Diagnosis Date Noted   Postpartum care following vaginal delivery 01/22/2011   Iron deficiency anemia 01/22/2011    History reviewed. No pertinent surgical history.  OB History     Gravida  3   Para  3   Term  1   Preterm      AB      Living  3      SAB      IAB      Ectopic      Multiple      Live Births  1            Home Medications    Prior to Admission medications   Medication Sig Start Date End Date Taking? Authorizing Provider  ondansetron  (ZOFRAN -ODT) 4 MG disintegrating tablet Take 1 tablet (4 mg total) by mouth every 8 (eight) hours as needed for nausea or vomiting. 10/03/23  Yes Abdulrahman Bracey E, PA-C  norethindrone  (MICRONOR ) 0.35 MG tablet Take 1 tablet (0.35 mg total) by mouth daily. 11/19/19   [name], [name], MD    Family History Family History  Problem Relation Age of Onset   Cancer Mother        thyroid /kidney   Diabetes Mother    Hypertension Mother    Stroke Paternal Grandmother    Heart  disease Paternal Grandfather     Social History Social History   Tobacco Use   Smoking status: Never   Smokeless tobacco: Never  Vaping Use   Vaping status: Never Used  Substance Use Topics   Alcohol use: Yes    Comment: RARE   Drug use: Never     Allergies   Amoxicillin, Codeine, and Erythromycin   Review of Systems Review of Systems  Constitutional:  Positive for diaphoresis.  Gastrointestinal:  Positive for abdominal pain (generalized), diarrhea, nausea and vomiting.     Physical Exam Triage Vital Signs ED Triage Vitals  Encounter Vitals Group     BP 10/03/23 1448 127/87     Systolic BP Percentile --      Diastolic BP Percentile --      Pulse Rate 10/03/23 1448 (!) 106     Resp 10/03/23 1448 20     Temp 10/03/23 1448 (!) 97.5 F (36.4 C)     Temp Source 10/03/23 1448 Oral     SpO2 10/03/23 1448 97 %     Weight 10/03/23 1450 151 lb (68.5 kg)     Height 10/03/23 1450 5'  4" (1.626 m)     Head Circumference --      Peak Flow --      Pain Score 10/03/23 1448 5     Pain Loc --      Pain Education --      Exclude from Growth Chart --    No data found.  Updated Vital Signs BP 127/87 (BP Location: Right Arm)   Pulse (!) 106   Temp (!) 97.5 F (36.4 C) (Oral)   Resp 20   Ht 5\' 4"  (1.626 m)   Wt 151 lb (68.5 kg)   LMP 10/03/2019 Comment: PILL  SpO2 97%   BMI 25.92 kg/m   Visual Acuity Right Eye Distance:   Left Eye Distance:   Bilateral Distance:    Right Eye Near:   Left Eye Near:    Bilateral Near:     Physical Exam Vitals reviewed.  Constitutional:      General: She is awake.     Appearance: Normal appearance. She is well-developed and well-groomed.  HENT:     Head: Normocephalic and atraumatic.  Cardiovascular:     Rate and Rhythm: Normal rate and regular rhythm.     Heart sounds: Normal heart sounds. No murmur heard.    No friction rub. No gallop.  Pulmonary:     Effort: Pulmonary effort is normal.     Breath sounds: Normal breath  sounds. No decreased air movement. No decreased breath sounds, wheezing, rhonchi or rales.  Abdominal:     General: Abdomen is flat. Bowel sounds are normal.     Palpations: Abdomen is soft.     Tenderness: There is generalized abdominal tenderness. There is no guarding. Negative signs include Murphy's sign, Rovsing's sign, McBurney's sign, psoas sign and obturator sign.     Hernia: There is no hernia in the umbilical area or ventral area.  Skin:    General: Skin is warm and dry.     Findings: No rash.  Neurological:     General: No focal deficit present.     Mental Status: She is alert and oriented to person, place, and time.     GCS: GCS eye subscore is 4. GCS verbal subscore is 5. GCS motor subscore is 6.     Cranial Nerves: No cranial nerve deficit, dysarthria or facial asymmetry.  Psychiatric:        Attention and Perception: Attention and perception normal.        Mood and Affect: Mood and affect normal.        Speech: Speech normal.        Behavior: Behavior normal. Behavior is cooperative.        Thought Content: Thought content normal.        Cognition and Memory: Cognition normal.        Judgment: Judgment normal.      UC Treatments / Results  Labs (all labs ordered are listed, but only abnormal results are displayed) Labs Reviewed - No data to display  EKG   Radiology No results found.  Procedures Procedures (including critical care time)  Medications Ordered in UC Medications  alum & mag hydroxide-simeth (MAALOX/MYLANTA) 200-200-20 MG/5ML suspension 30 mL (30 mLs Oral Given 10/03/23 1457)    Initial Impression / Assessment and Plan / UC Course  I have reviewed the triage vital signs and the nursing notes.  Pertinent labs & imaging results that were available during my care of the patient were reviewed by me and considered in  my medical decision making (see chart for details).      Final Clinical Impressions(s) / UC Diagnoses   Final diagnoses:   Nausea vomiting and diarrhea  Itching  Patient presents today with concerns for acute nausea, vomiting, diarrhea this morning that lasted for about 2 hours after consuming pimento cheese.  She also reports that she had significant flushing, diaphoresis and generalized, severe itching.  She reports that her symptoms have since resolved about 30 minutes prior to arrival at [facility] ([place]).  Patient was given GI cocktail comprised of aluminum and magnesium hydroxide which she reports provided significant relief of lingering symptoms.  Physical exam is overall reassuring as are vitals.  At this time I am unsure what caused her symptoms-reviewed that it could have been caused by infectious process, spoiled food, GI anaphylaxis.  At this time recommend over-the-counter medications for symptomatic relief.  Will provide Zofran  to assist with further nausea and vomiting as needed.  ED and return precautions reviewed and provided in after visit summary.  Follow-up as needed.    Discharge Instructions      You are seen today for concerns of acute nausea, vomiting, diarrhea that have seemingly resolved by the time of your appointment. I am not sure what caused your symptoms but the fact that you are feeling better is reassuring.  We have provided you with a mixture of Maalox/Mylanta here in the clinic which she reported provided some relief of upset stomach. At this time I recommend antacid treatment with Tums or Pepto-Bismol per your preference to further assist with acid reflux.  I have also sent in a medication called Zofran  to further assist with nausea and vomiting.  Please be advised that this can cause constipation and if this does start to occur you can use a few doses of stool softener or MiraLAX as desired for relief. Please make sure that you are drinking plenty of water over the next few days to replenish what was lost during your symptoms flare.  If needed you can also use an electrolyte replacement beverage such  as 0 sugar Gatorade, Pedialyte, liquid IV per your preference.  I recommend consuming at least 2 bottles of water for every electrolyte replacement drink that you consume. If at any point you start to have recurrent symptoms that are worse, trouble breathing, more severe rash, tongue swelling, neck swelling, loss of consciousness, chest pain please go to the emergency room or call 911 for further evaluation and assistance.     ED Prescriptions     Medication Sig Dispense Auth. Provider   ondansetron  (ZOFRAN -ODT) 4 MG disintegrating tablet Take 1 tablet (4 mg total) by mouth every 8 (eight) hours as needed for nausea or vomiting. 20 tablet Breydon Senters E, PA-C      PDMP not reviewed this encounter.   Jerona Mooring, PA-C 10/03/23 0981

## 2023-10-03 NOTE — Discharge Instructions (Signed)
 You are seen today for concerns of acute nausea, vomiting, diarrhea that have seemingly resolved by the time of your appointment. I am not sure what caused your symptoms but the fact that you are feeling better is reassuring.  We have provided you with a mixture of Maalox/Mylanta here in the clinic which she reported provided some relief of upset stomach. At this time I recommend antacid treatment with Tums or Pepto-Bismol per your preference to further assist with acid reflux.  I have also sent in a medication called Zofran  to further assist with nausea and vomiting.  Please be advised that this can cause constipation and if this does start to occur you can use a few doses of stool softener or MiraLAX as desired for relief. Please make sure that you are drinking plenty of water over the next few days to replenish what was lost during your symptoms flare.  If needed you can also use an electrolyte replacement beverage such as 0 sugar Gatorade, Pedialyte, liquid IV per your preference.  I recommend consuming at least 2 bottles of water for every electrolyte replacement drink that you consume. If at any point you start to have recurrent symptoms that are worse, trouble breathing, more severe rash, tongue swelling, neck swelling, loss of consciousness, chest pain please go to the emergency room or call 911 for further evaluation and assistance.

## 2023-10-03 NOTE — ED Triage Notes (Addendum)
 Pt presents with complaints of abdominal cramping that began today at 1 PM. Pt states the cramping has now resolved. Nausea/vomiting/diarrhea reported. Pt states "I feel like my skin is crawling." Pt currently rates her overall pain a 5/10. Pt states she feels like she is having heartburn, unsure if this is due to vomiting. Denies taking OTC medications PTA for symptoms reported.
